# Patient Record
Sex: Female | Born: 1975 | Hispanic: No | Smoking: Never smoker
Health system: Southern US, Community
[De-identification: ages and names within clinical notes are randomized; demographics above are authoritative.]

---

## 1998-01-13 ENCOUNTER — Inpatient Hospital Stay (HOSPITAL_COMMUNITY): Admission: AD | Admit: 1998-01-13 | Discharge: 1998-01-13 | Payer: Self-pay | Admitting: Obstetrics & Gynecology

## 1998-01-16 ENCOUNTER — Ambulatory Visit (HOSPITAL_COMMUNITY): Admission: RE | Admit: 1998-01-16 | Discharge: 1998-01-16 | Payer: Self-pay | Admitting: Obstetrics

## 1998-03-29 ENCOUNTER — Encounter: Admission: RE | Admit: 1998-03-29 | Discharge: 1998-03-29 | Payer: Self-pay | Admitting: Obstetrics & Gynecology

## 1998-03-29 ENCOUNTER — Other Ambulatory Visit: Admission: RE | Admit: 1998-03-29 | Discharge: 1998-03-29 | Payer: Self-pay | Admitting: Obstetrics & Gynecology

## 2001-10-10 ENCOUNTER — Inpatient Hospital Stay (HOSPITAL_COMMUNITY): Admission: AD | Admit: 2001-10-10 | Discharge: 2001-10-10 | Payer: Self-pay | Admitting: *Deleted

## 2001-11-14 ENCOUNTER — Encounter: Admission: RE | Admit: 2001-11-14 | Discharge: 2001-11-14 | Payer: Self-pay | Admitting: Family Medicine

## 2001-11-15 ENCOUNTER — Encounter (INDEPENDENT_AMBULATORY_CARE_PROVIDER_SITE_OTHER): Payer: Self-pay | Admitting: *Deleted

## 2001-11-24 ENCOUNTER — Encounter: Admission: RE | Admit: 2001-11-24 | Discharge: 2001-11-24 | Payer: Self-pay | Admitting: Family Medicine

## 2001-12-09 ENCOUNTER — Ambulatory Visit (HOSPITAL_COMMUNITY): Admission: RE | Admit: 2001-12-09 | Discharge: 2001-12-09 | Payer: Self-pay | Admitting: *Deleted

## 2001-12-09 ENCOUNTER — Encounter: Admission: RE | Admit: 2001-12-09 | Discharge: 2001-12-09 | Payer: Self-pay | Admitting: Family Medicine

## 2001-12-25 ENCOUNTER — Encounter: Admission: RE | Admit: 2001-12-25 | Discharge: 2001-12-25 | Payer: Self-pay | Admitting: Family Medicine

## 2002-01-09 ENCOUNTER — Encounter: Admission: RE | Admit: 2002-01-09 | Discharge: 2002-01-09 | Payer: Self-pay | Admitting: Family Medicine

## 2002-01-20 ENCOUNTER — Encounter: Admission: RE | Admit: 2002-01-20 | Discharge: 2002-01-20 | Payer: Self-pay | Admitting: Family Medicine

## 2002-01-29 ENCOUNTER — Encounter: Admission: RE | Admit: 2002-01-29 | Discharge: 2002-01-29 | Payer: Self-pay | Admitting: Family Medicine

## 2002-03-03 ENCOUNTER — Encounter: Admission: RE | Admit: 2002-03-03 | Discharge: 2002-03-03 | Payer: Self-pay | Admitting: Sports Medicine

## 2002-03-20 ENCOUNTER — Encounter: Admission: RE | Admit: 2002-03-20 | Discharge: 2002-03-20 | Payer: Self-pay | Admitting: Family Medicine

## 2002-03-24 ENCOUNTER — Encounter: Admission: RE | Admit: 2002-03-24 | Discharge: 2002-03-24 | Payer: Self-pay | Admitting: Family Medicine

## 2002-04-09 ENCOUNTER — Encounter: Admission: RE | Admit: 2002-04-09 | Discharge: 2002-04-09 | Payer: Self-pay | Admitting: Family Medicine

## 2002-04-10 ENCOUNTER — Ambulatory Visit (HOSPITAL_COMMUNITY): Admission: RE | Admit: 2002-04-10 | Discharge: 2002-04-10 | Payer: Self-pay | Admitting: Internal Medicine

## 2002-04-17 ENCOUNTER — Encounter: Admission: RE | Admit: 2002-04-17 | Discharge: 2002-04-17 | Payer: Self-pay | Admitting: Family Medicine

## 2002-04-21 ENCOUNTER — Encounter: Admission: RE | Admit: 2002-04-21 | Discharge: 2002-04-21 | Payer: Self-pay | Admitting: Sports Medicine

## 2002-04-22 ENCOUNTER — Encounter (HOSPITAL_COMMUNITY): Admission: RE | Admit: 2002-04-22 | Discharge: 2002-05-22 | Payer: Self-pay | Admitting: *Deleted

## 2002-04-30 ENCOUNTER — Encounter: Admission: RE | Admit: 2002-04-30 | Discharge: 2002-04-30 | Payer: Self-pay | Admitting: Family Medicine

## 2002-05-01 ENCOUNTER — Inpatient Hospital Stay (HOSPITAL_COMMUNITY): Admission: AD | Admit: 2002-05-01 | Discharge: 2002-05-06 | Payer: Self-pay | Admitting: Family Medicine

## 2002-05-03 ENCOUNTER — Encounter: Payer: Self-pay | Admitting: *Deleted

## 2002-05-04 ENCOUNTER — Encounter: Payer: Self-pay | Admitting: *Deleted

## 2002-05-09 ENCOUNTER — Encounter: Payer: Self-pay | Admitting: *Deleted

## 2002-05-09 ENCOUNTER — Inpatient Hospital Stay (HOSPITAL_COMMUNITY): Admission: AD | Admit: 2002-05-09 | Discharge: 2002-05-12 | Payer: Self-pay | Admitting: Family Medicine

## 2002-05-10 ENCOUNTER — Encounter: Payer: Self-pay | Admitting: Family Medicine

## 2002-06-12 ENCOUNTER — Encounter: Admission: RE | Admit: 2002-06-12 | Discharge: 2002-06-12 | Payer: Self-pay | Admitting: Family Medicine

## 2002-07-01 ENCOUNTER — Encounter: Admission: RE | Admit: 2002-07-01 | Discharge: 2002-07-01 | Payer: Self-pay | Admitting: Family Medicine

## 2002-07-23 ENCOUNTER — Encounter: Admission: RE | Admit: 2002-07-23 | Discharge: 2002-07-23 | Payer: Self-pay | Admitting: Family Medicine

## 2002-08-17 ENCOUNTER — Encounter: Admission: RE | Admit: 2002-08-17 | Discharge: 2002-08-17 | Payer: Self-pay | Admitting: Family Medicine

## 2006-02-25 ENCOUNTER — Emergency Department (HOSPITAL_COMMUNITY): Admission: EM | Admit: 2006-02-25 | Discharge: 2006-02-25 | Payer: Self-pay | Admitting: Emergency Medicine

## 2006-03-15 ENCOUNTER — Encounter (INDEPENDENT_AMBULATORY_CARE_PROVIDER_SITE_OTHER): Payer: Self-pay | Admitting: *Deleted

## 2006-05-22 ENCOUNTER — Emergency Department (HOSPITAL_COMMUNITY): Admission: EM | Admit: 2006-05-22 | Discharge: 2006-05-22 | Payer: Self-pay | Admitting: Emergency Medicine

## 2007-07-01 ENCOUNTER — Emergency Department (HOSPITAL_COMMUNITY): Admission: EM | Admit: 2007-07-01 | Discharge: 2007-07-01 | Payer: Self-pay | Admitting: Emergency Medicine

## 2007-07-04 ENCOUNTER — Encounter: Payer: Self-pay | Admitting: *Deleted

## 2007-08-11 ENCOUNTER — Ambulatory Visit: Payer: Self-pay | Admitting: Family Medicine

## 2009-11-16 IMAGING — CR DG CHEST 2V
2 series · 2 of 2 positions shown · non-contrast
Comparison: None available.

CLINICAL DATA: Fever, shaking.

CHEST - 2 VIEW

[w chest pa]
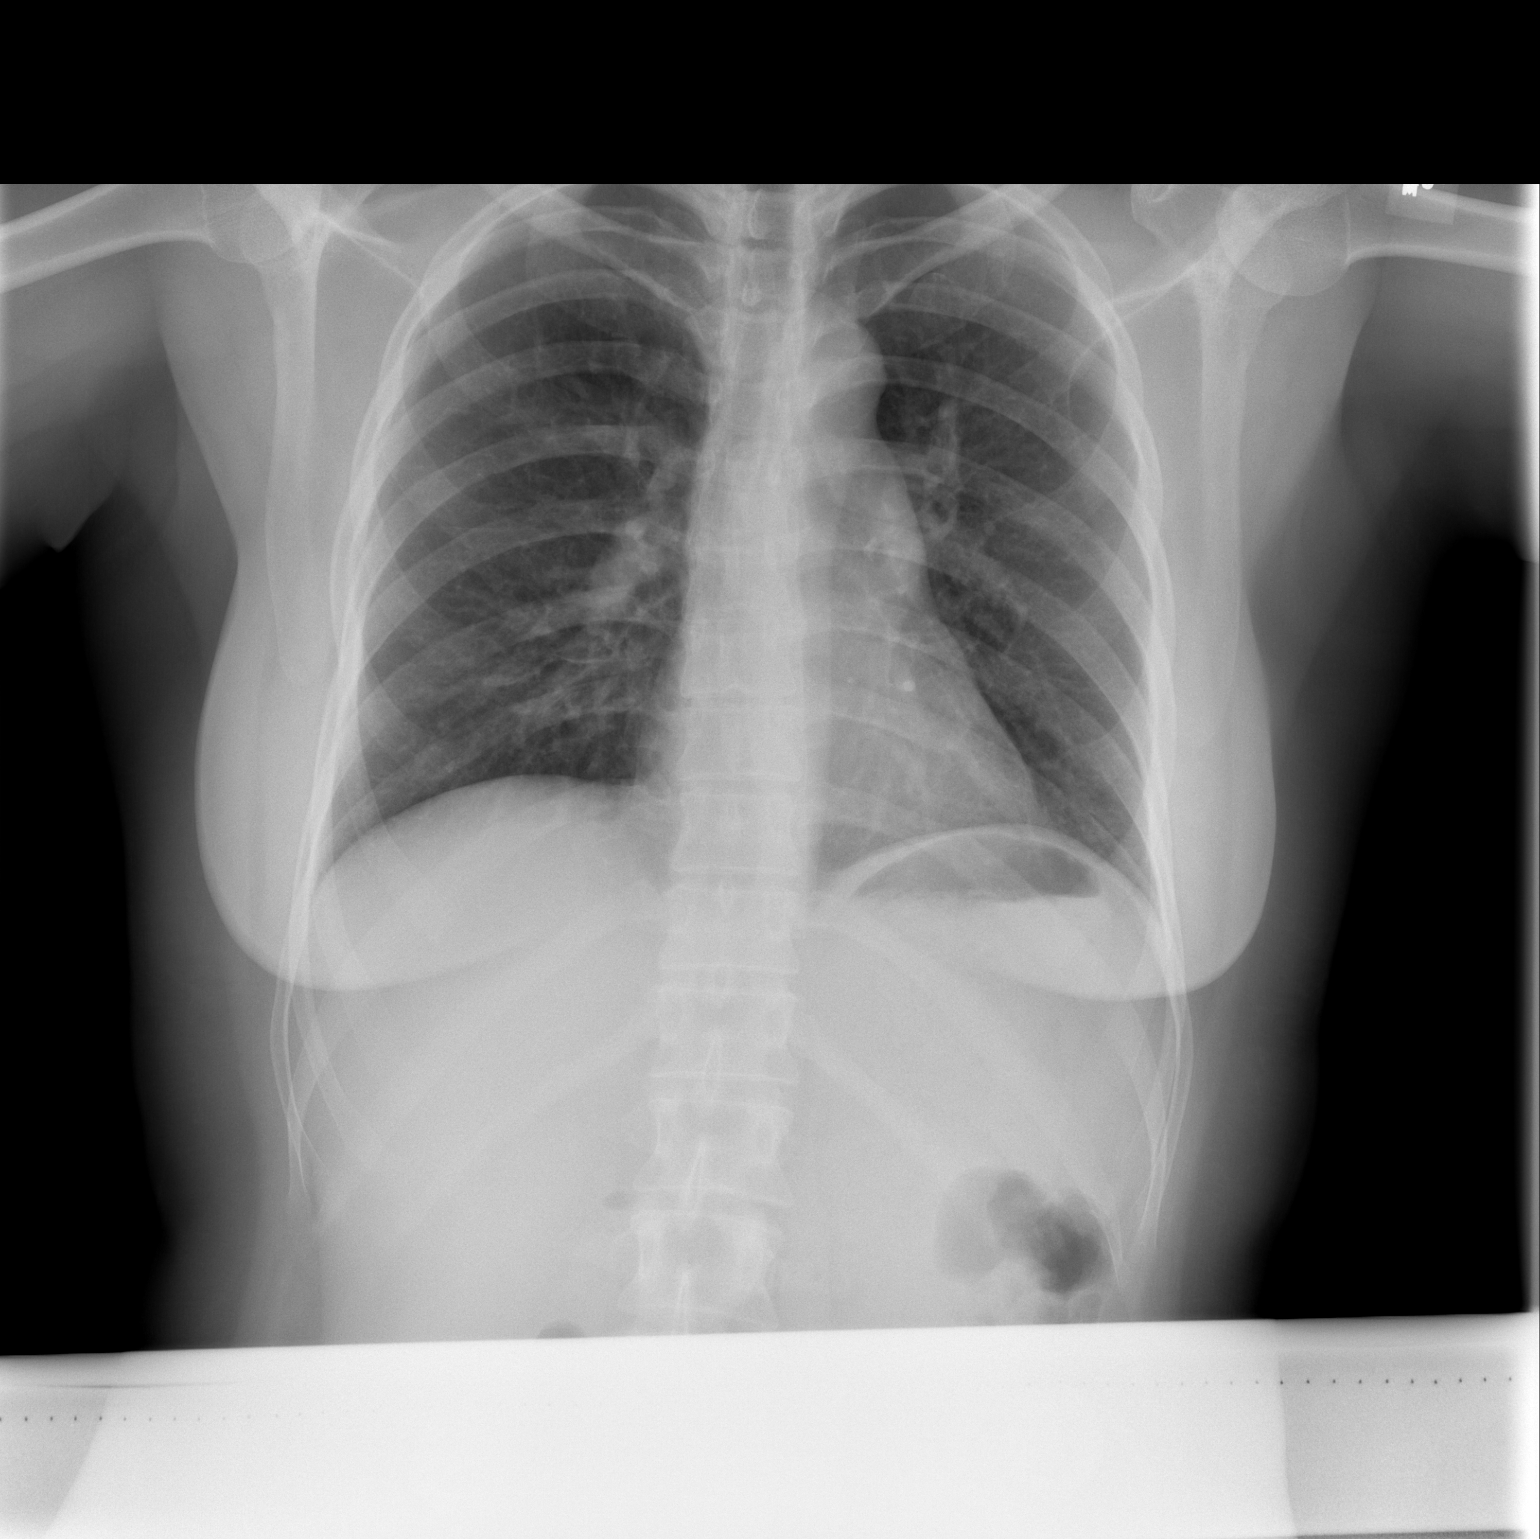

[w chest lat]
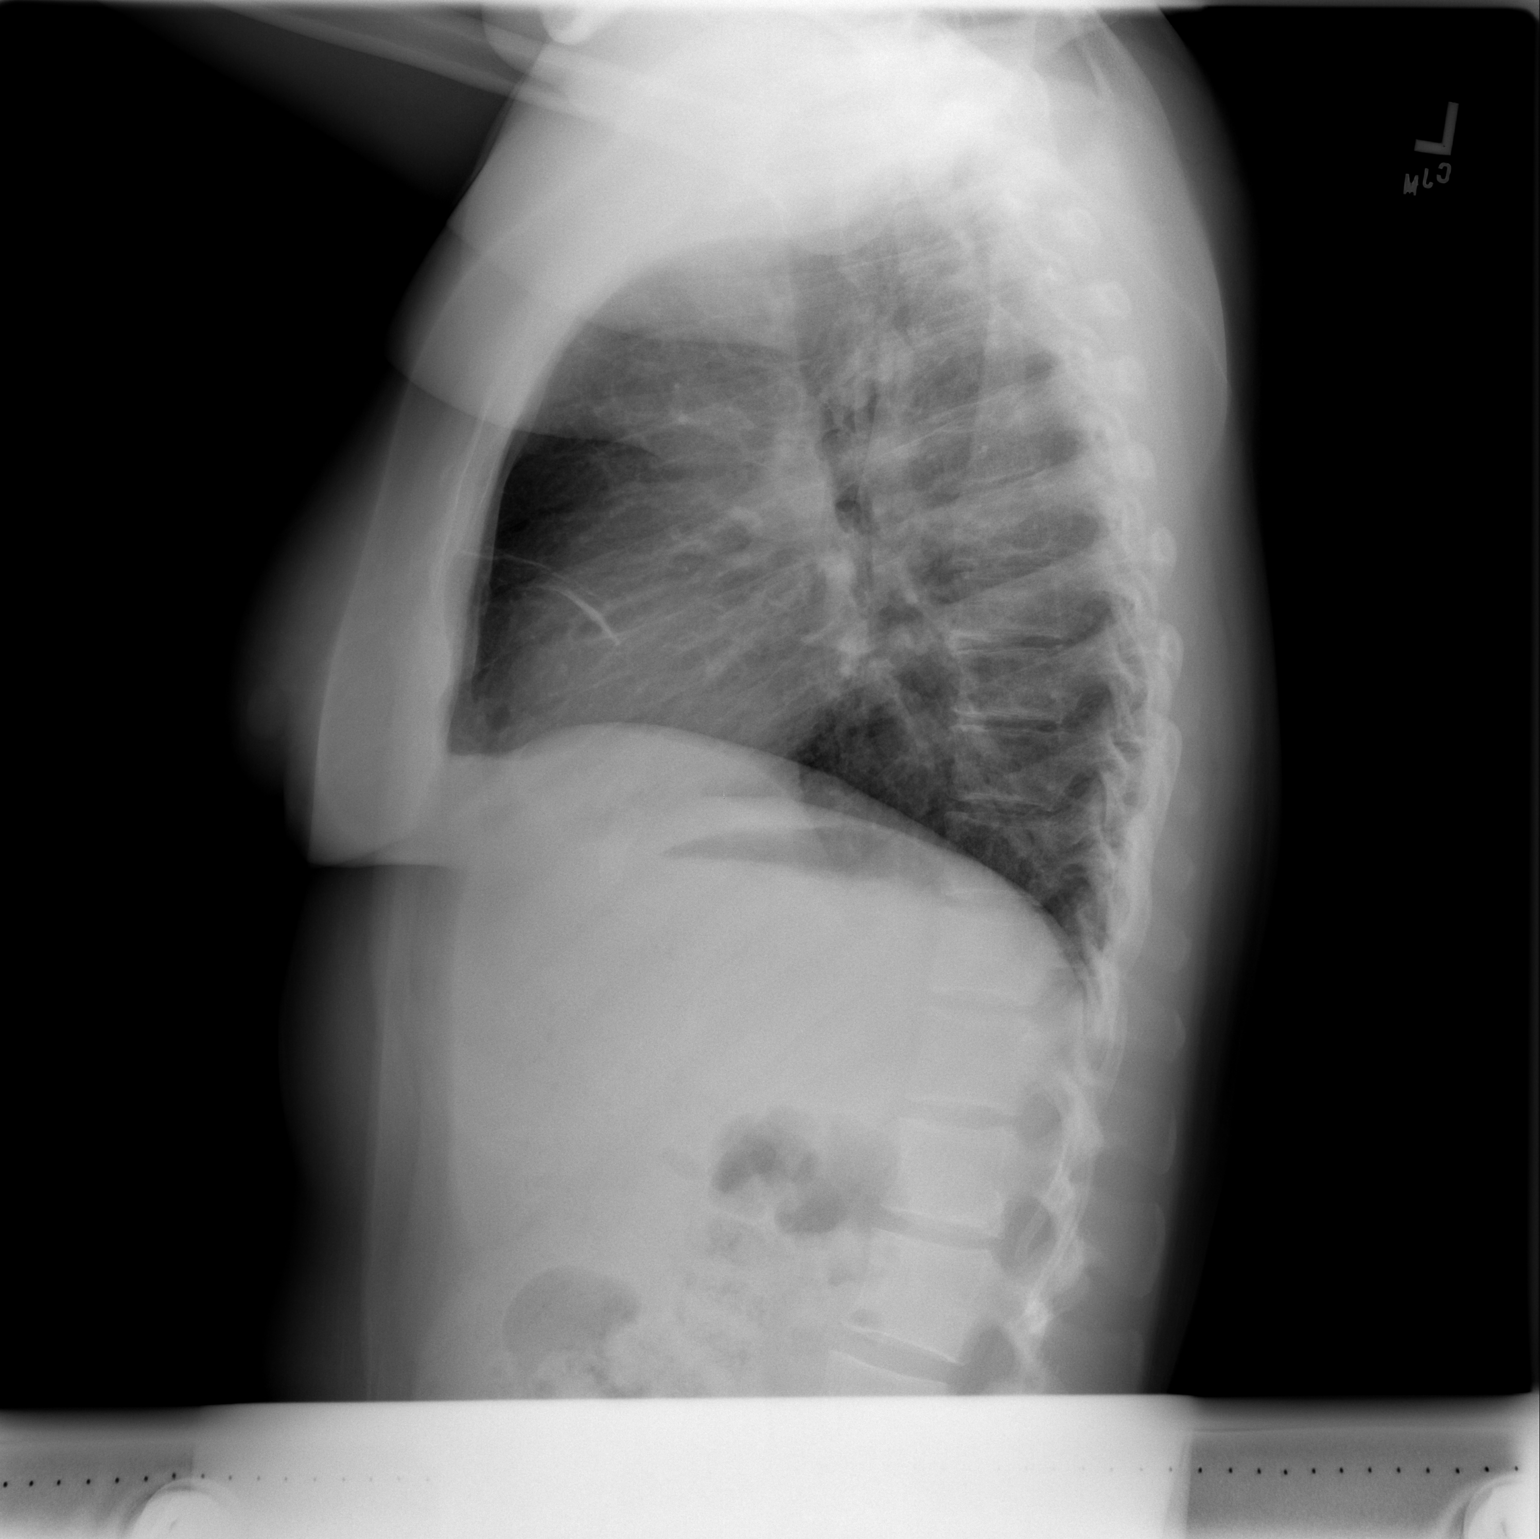

[2 of 2 positions shown; findings below may reference images not displayed]

FINDINGS: There is no focal airspace disease.  Linear opacity seen
on the lateral view is compatible with scarring in the right middle
lobe.  There is no pleural effusion.  Heart size is normal.
IMPRESSION: No acute disease.

## 2010-06-02 NOTE — Discharge Summary (Signed)
NAMEMarland Kitchen  Ellen Moody, Ellen Moody NO.:  [id number]   MEDICAL RECORD NO.:  [id number]                   PATIENT TYPE:  REC   LOCATION:  [facility]                                  FACILITY:  [facility]   PHYSICIAN:  Conni Elliot, M.D.             DATE OF BIRTH:  1975-06-29   DATE OF ADMISSION:  05/09/2002  DATE OF DISCHARGE:  05/12/2002                                 DISCHARGE SUMMARY   PRIMARY CARE Nishka Heide:  Dr. Duanne Guess with [facility].   DISCHARGE DIAGNOSIS - PRINCIPAL:  Preeclampsia.   DISCHARGE DIAGNOSES - ADDITIONAL:  1. Acute renal insufficiency.  2. Suspect chronic renal insufficiency.  3. Status post postpartum hemorrhage.  4. Status post disseminated intravascular coagulation.  5. Status post normal spontaneous vaginal delivery.   SUMMARY OF HOSPITAL COURSE:  1. Preeclampsia.  Patient was initially evaluated, and secondary to systemic     symptoms, including headache, dizziness, lower extremity edema, and     abdominal pain, patient had developed preeclampsia.  Initial labs     demonstrated uric acid at 7.8, platelets 260, UA was negative.  Patient     was begun on magnesium, per routine.  Patient's mag level was monitored     closely and was discontinued on the 26th and, since that time, has     trended downwards, now a level of 3.1 compared to previous day of 5.3.     Patient did develop diplopia secondary to supratherapeutic magnesium     level, which has resolved now that magnesium has been turned off and her     mag level has trended downwards.  Patient has undergone significant     diuresis with -1403 in the past 24 hours and -250 in the last eight     hours.  Lower extremity edema, abdominal pain, headache have all fully     resolved.  Patient continues to complain of slight dizziness while lying     in bed, but this resolves with ambulation.  It is felt that this is     secondary to her resolving clinical picture of preeclampsia as  well as     still elevated magnesium level at 3.1, which is trending downward.  At     time of dictation, patient is stable and in improved condition and, given     opportunity for close hospital followup, is felt to have resolved from     her preeclamptic state.  The initial inciting event for this is felt to     be her previous hospitalization surrounding the birth of her child,     during which time she developed postpartum hemorrhage with questionable     DIC.  Patient did receive multiple blood products and significant fluid     resuscitation.  It was felt that her baseline chronic renal insufficiency     allowed her body to, following discharge, essentially achieve a fluid  overload state, which then lead to her admitting symptoms, per above.   1. Headache.  Per above, initial admitting headache has fully resolved.     Patient did undergo radiologic studies, including CT of the head.     Impression (1)  I cannot entirely exclude the possibility of acute     pituitary hemorrhage.  This is a difficult area to evaluate on CT, and     the findings may just be related to beam-hardening artifacts.     Correlation as to symptoms of pituitary apexes.  MRI may be beneficial to     deny this finding.  (2)  Normal CT otherwise.  No abnormal enhancement     was identified.  Patient then underwent MRI with preliminary report,     pituitary normal for postpartum state, a few small foci of subcortical     signal and frontoparietal lobes - nonspecific white matter lesions can be     seen with migraines, vasculitis, etc.  Normal diffusion study; no acute     stroke.  Normal MR venogram.  Patient's symptoms have fully resolved, and     given negative workup, it is felt that her initial neurological symptoms     were related to an edematous cerebral state, which is compatible with her     total body fluid overload in her preeclamptic state.   1. Renal.  Patient's earliest creatinine was 1.1; this  elevated to 1.5     following her complicated delivery.  This reached an apex of 1.8 and     since that time has trended back down to 1.5 where it has been stable.     It is unclear whether patient has an underlying renal insufficiency,     which predates her admission for delivery.  It is felt that at this time     patient is certainly stable and has diuresed well, kidney function is     stable, and patient is appropriate for discharge.  In consultation with     Dr. Gavin Potters, patient would be appropriate for close followup at both her     six-week postpartum visit with her primary care Marene Gilliam, Dr. Duanne Guess, as     well as approximately three months out, reevaluating her renal function     with repeat creatinine, and if continues to be elevated, further     evaluation and treatment for her likely then chronic renal insufficiency     could be pursued.   1. Vasculitis.  Per the MRI findings, the results are compatible with a     vasculitis; this workup was begun during this hospitalization.  Labs     pending at time of dictation include ANA, anticardiolipin antibody,     antiphospholipid antibody, anti-SS-A/Ro, anti-SS-B/LA.  Labs that have     come back at time of dictation include antithyroid antibody normal at     less than 30, thyroid peroxidase normal at 44.3.  It is recommended that     at time of followup with her primary care Lemmie Steinhaus, Dr. Duanne Guess, these labs     be reevaluated as well as followup on her pending labs at this time.     Patient did have an elevated ESR at 50.   DISCHARGE INSTRUCTIONS:  Patient is to follow up with her primary care  Vinita Prentiss, Dr. Duanne Guess, for routine postpartum six-week visit as well as in  approximately three months' time for reevaluation of her renal function  with, at a  minimum, BUN and creatinine being drawn.   DISCHARGE MEDICATIONS - NEW:  Percocet one tab q.4h. p.r.n. headache, number  10, 0 refills.  DISCHARGE MEDICATIONS - CONTINUED FROM PRIOR TO  ADMISSION:  1. Ferrous sulfate.  2. Colace.  3. Prenatal vitamins.  4. Ibuprofen.   DISCHARGE ACTIVITY:  Pelvic rest times six weeks.   DISCHARGE DIET:  Regular.     Loleta Books, M.D.    DL/MEDQ  D:  16/10/9602  T:  05/12/2002  Job:  [id number]   cc:   Maryelizabeth Rowan, M.D.  [facility]  [place], [place] [address]  Fax: [phone]

## 2010-10-12 LAB — COMPREHENSIVE METABOLIC PANEL WITH GFR
ALT: 57 — ABNORMAL HIGH
AST: 65 — ABNORMAL HIGH
Albumin: 3.8
Alkaline Phosphatase: 80
CO2: 21
Chloride: 104
Creatinine, Ser: 0.69
GFR calc Af Amer: >60
GFR calc non Af Amer: >60
Potassium: 3.9
Total Bilirubin: 1.1

## 2010-10-12 LAB — CBC
HCT: 35.4 — ABNORMAL LOW
Hemoglobin: 12.4
MCHC: 35
MCV: 87.6
Platelets: 214
RBC: 4.04
RDW: 12.8
WBC: 8.6

## 2010-10-12 LAB — URINE MICROSCOPIC-ADD ON

## 2010-10-12 LAB — DIFFERENTIAL
Basophils Absolute: 0
Basophils Relative: 0
Eosinophils Absolute: 0
Eosinophils Relative: 0
Lymphocytes Relative: 17
Lymphs Abs: 1.5
Monocytes Absolute: 0.2
Monocytes Relative: 2 — ABNORMAL LOW
Neutro Abs: 6.8
Neutrophils Relative %: 80 — ABNORMAL HIGH

## 2010-10-12 LAB — URINALYSIS, ROUTINE W REFLEX MICROSCOPIC
Bilirubin Urine: NEGATIVE
Glucose, UA: NEGATIVE
Hgb urine dipstick: NEGATIVE
Ketones, ur: NEGATIVE
Nitrite: NEGATIVE
Protein, ur: NEGATIVE
Specific Gravity, Urine: 1.016
Urobilinogen, UA: 1
pH: 8

## 2010-10-12 LAB — COMPREHENSIVE METABOLIC PANEL
BUN: 12
Calcium: 8.8
Glucose, Bld: 119 — ABNORMAL HIGH
Sodium: 136
Total Protein: 6.8

## 2010-10-12 LAB — POCT PREGNANCY, URINE: Preg Test, Ur: NEGATIVE

## 2010-10-12 LAB — INFLUENZA A+B VIRUS AG-DIRECT(RAPID): Inflenza A Ag: NEGATIVE

## 2013-05-14 ENCOUNTER — Encounter: Payer: Self-pay | Admitting: Family Medicine

## 2013-05-14 ENCOUNTER — Other Ambulatory Visit (HOSPITAL_COMMUNITY)
Admission: RE | Admit: 2013-05-14 | Discharge: 2013-05-14 | Disposition: A | Discharge status: Home / Self Care | Payer: PRIVATE HEALTH INSURANCE | Source: Ambulatory Visit | Attending: Family Medicine | Admitting: Family Medicine

## 2013-05-14 ENCOUNTER — Ambulatory Visit (INDEPENDENT_AMBULATORY_CARE_PROVIDER_SITE_OTHER): Payer: Self-pay | Admitting: Family Medicine

## 2013-05-14 VITALS — BP 122/77 | HR 80 | Temp 97.7°F | Ht 61.0 in | Wt 130.0 lb

## 2013-05-14 DIAGNOSIS — Z113 Encounter for screening for infections with a predominantly sexual mode of transmission: Secondary | ICD-10-CM | POA: Insufficient documentation

## 2013-05-14 DIAGNOSIS — N760 Acute vaginitis: Secondary | ICD-10-CM

## 2013-05-14 DIAGNOSIS — B373 Candidiasis of vulva and vagina: Secondary | ICD-10-CM

## 2013-05-14 DIAGNOSIS — R109 Unspecified abdominal pain: Secondary | ICD-10-CM

## 2013-05-14 DIAGNOSIS — N912 Amenorrhea, unspecified: Secondary | ICD-10-CM

## 2013-05-14 DIAGNOSIS — F32A Depression, unspecified: Secondary | ICD-10-CM

## 2013-05-14 DIAGNOSIS — F329 Major depressive disorder, single episode, unspecified: Secondary | ICD-10-CM

## 2013-05-14 DIAGNOSIS — B3731 Acute candidiasis of vulva and vagina: Secondary | ICD-10-CM

## 2013-05-14 DIAGNOSIS — F3289 Other specified depressive episodes: Secondary | ICD-10-CM

## 2013-05-14 DIAGNOSIS — Z3009 Encounter for other general counseling and advice on contraception: Secondary | ICD-10-CM

## 2013-05-14 DIAGNOSIS — Z1151 Encounter for screening for human papillomavirus (HPV): Secondary | ICD-10-CM | POA: Insufficient documentation

## 2013-05-14 DIAGNOSIS — Z124 Encounter for screening for malignant neoplasm of cervix: Secondary | ICD-10-CM

## 2013-05-14 DIAGNOSIS — M25559 Pain in unspecified hip: Secondary | ICD-10-CM

## 2013-05-14 DIAGNOSIS — Z01419 Encounter for gynecological examination (general) (routine) without abnormal findings: Secondary | ICD-10-CM | POA: Insufficient documentation

## 2013-05-14 LAB — POCT WET PREP (WET MOUNT)
CLUE CELLS WET PREP WHIFF POC: NEGATIVE
WBC, Wet Prep HPF POC: >20

## 2013-05-14 LAB — POCT URINE PREGNANCY: PREG TEST UR: NEGATIVE

## 2013-05-14 MED ORDER — FLUCONAZOLE 150 MG PO TABS: 150.0000 mg | ORAL_TABLET | Freq: Once | ORAL | Status: DC

## 2013-05-14 MED ORDER — NORGESTIMATE-ETH ESTRADIOL 0.25-35 MG-MCG PO TABS: 1.0000 | ORAL_TABLET | Freq: Every day | ORAL | Status: DC

## 2013-05-14 NOTE — Assessment & Plan Note (Signed)
Urine pregnancy negative today Ongoing abdominal pain for 7 months is concerning, especially with abnormal pelvic exam in pain. Have ordered ultrasound of the pelvis today. Patient to followup in 2 weeks we'll discuss ultrasound results and at that time she should have her orange card. Will order her yearly labs at that time as well.

## 2013-05-14 NOTE — Assessment & Plan Note (Addendum)
Cervical Pap screening performed today. Added gonorrhea and Chlamydia culture to Pap. Will call patient with results

## 2013-05-14 NOTE — Assessment & Plan Note (Signed)
Patient with wet prep today showing yeast. Diflucan prescription given

## 2013-05-14 NOTE — Patient Instructions (Addendum)
Eleccin del mtodo anticonceptivo (Contraception Choices) La anticoncepcin (control de la natalidad) es el uso de cualquier mtodo o dispositivo para evitar el embarazo. A continuacin se indican algunos de esos mtodos. MTODOS HORMONALES   El Implante contraconceptivo consiste en un tubo plstico delgado que contiene la hormona progesterona. No contiene estrgenos. El mdico inserta el tubo en la parte interna del brazo. El tubo puede permanecer en el lugar durante 3 aos. Despus de los 3 aos debe retirarse. El implante impide que los ovarios liberen vulos (ovulacin), espesa el moco cervical, lo que evita que los espermatozoides ingresen al tero y hace ms delgada la membrana que cubre el interior del tero.  Inyecciones de progesterona sola: las administra el mdico cada 3 meses para evitar el embarazo. La progesterona sinttica impide que los ovarios liberen vulos. Tambin hacen que el moco cervical se espese y modifique el tejido de recubrimiento interno del tero. Esto hace ms difcil que los espermatozoides sobrevivan en el tero.  Las pldoras anticonceptivas contienen estrgenos y progesterona. Su funcin es evitar que los ovarios liberen vulos (ovulacin). Las hormonas de los anticonceptivos orales hacen que el moco cervical se haga ms espeso, lo que evita que el esperma ingrese al tero. Las pldoras anticonceptivas son recetadas por el mdico.Tambin se utilizan para tratar los perodos menstruales abundantes.  Minipldora: este tipo de pldora anticonceptiva contiene slo hormona progesterona. Deben tomarse todos los das del mes y debe recetarlas el mdico.  El parche de control de natalidad: contiene hormonas similares a las que contienen las pldoras anticonceptivas. Deben cambiarse una vez por semana y se utilizan bajo prescripcin mdica.  Anillo vaginal: contiene hormonas similares a las que contienen las pldoras anticonceptivas. Se deja colocado durante tres semanas,  se lo retira durante 1 semana y luego se coloca uno nuevo. La paciente debe sentirse cmoda al insertar y retirar el anillo de la vagina.Es necesaria la prescripcin mdica.  Anticonceptivos de emergencia: son mtodos para evitar un embarazo despus de una relacin sexual sin proteccin. Esta pldora puede tomarse inmediatamente despus de tener relaciones sexuales o hasta 5 das de haber tenido sexo sin proteccin. Es ms efectiva si se toma poco tiempo despus de la relacin sexual. Los anticonceptivos de emergencia estn disponibles sin prescripcin mdica. Consltelo con su farmacutico. No use los anticonceptivos de emergencia como nico mtodo anticonceptivo. MTODOS DE BARRERA   Condn masculino: es una vaina delgada (ltex o goma) que se coloca cubriendo al pene durante el acto sexual. Puede usarse con espermicida para aumentar la efectividad.  Condn femenino. Es una funda delicada y blanda que se adapta holgadamente a la vagina antes de las relaciones sexuales.  Diafragma: es una barrera de ltex redonda y suave que debe ser recomendado por un profesional. Se inserta en la vagina, junto con un gel espermicida. Debe insertarse antes de tener relaciones sexuales. Debe dejar el diafragma colocado en la vagina durante 6 a 8 horas despus de la relacin sexual.  Capuchn cervical: es una barrera de ltex o taza plstica redonda y suave que cubre el cuello del tero y debe ser colocada por un mdico. Puede dejarlo colocado en la vagina hasta 48 horas despus de las relaciones sexuales.  Esponja: es una pieza blanda y circular de espuma de poliuretano. Contiene un espermicida. Se inserta en la vagina despus de mojarla y antes de las relaciones sexuales.  Espermicidas: son sustancias qumicas que matan o bloquean al esperma y no lo dejan ingresar al cuello del tero y al tero. Vienen   en forma de cremas, geles, supositorios, espuma o comprimidos. No es necesario tener Furniture conservator/restorer. Se insertan en  la vagina con un aplicador antes de Clinical biochemist. El proceso debe repetirse cada vez que tiene relaciones sexuales. ANTICONCEPTIVOS INTRAUTERINOS  Dispositivo intrauterino (DIU) es un dispositivo en forma de T que se coloca en el tero durante el perodo menstrual, para Therapist, occupational. Hay dos tipos:  DIU de cobre: este tipo de DIU est recubierto con un alambre de cobre y se inserta dentro del tero. El cobre hace que el tero y las trompas de Falopio produzcan un liquido que [employer] espermatozoides. Puede permanecer colocado durante 10 aos.  DIU con hormona: este tipo de DIU contiene la hormona progestina (progesterona sinttica). La hormona espesa el moco cervical y evita que los espermatozoides ingresen al tero y tambin afina la membrana que cubre el tero para evitar la implantacin del vulo fertilizado. La hormona debilita o destruye los espermatozoides que ingresan al tero. Puede Nutritional therapist durante 3 5 aos, segn el tipo de DIU que se utilice. MTODOS ANTICONCEPTIVOS PERMANENTES  Ligadura de trompas en la mujer: se realiza sellando, atando u obstruyendo quirrgicamente las trompas de Falopio lo que impide que el vulo descienda hacia el tero.  Esterilizacin histeroscpica: Implica la colocacin de un pequeo espiral o la insercin en cada trompa de Falopio. El mdico utiliza una tcnica llamada histeroscopa para Teacher, music procedimiento. El dispositivo produce la formacin de tejido Pensions consultant. Esto da como resultado una obstruccin permanente de las trompas de Falopio, de modo que la esperma no pueda fertilizar el vulo. Demora alrededor de 3 meses despus del procedimiento hasta que el conducto se obstruye. Tendr que usar otro mtodo anticonceptivo durante al menos 3 meses.  Esterilizacin masculina: se realiza ligando los conductos por los que pasan los espermatozoides (vasectoma).Esto impide que el esperma ingrese a la vagina durante el acto  sexual. Luego del procedimiento, el hombre puede eyacular lquido (semen). MTODOS DE PLANIFICACIN NATURAL  Planificacin familiar natural: consiste en no Clinical biochemist o usar un mtodo de barrera (condn, [place], capuchn cervical) en los [employer] la mujer podra quedar [place].  Mtodo de calendario: consiste en el seguimiento de la duracin de cada ciclo menstrual y la identificacin de los perodos frtiles.  Mtodo de ovulacin: Dance movement psychotherapist las relaciones sexuales durante la ovulacin.  Mtodo sintotrmico: Energy manager sexuales en la poca en la que se est ovulando, utilizando un termmetro y tendiendo en cuenta los sntomas de la ovulacin.  Mtodo postovulacin: Museum/gallery conservator las relaciones sexuales para despus de haber ovulado. Independientemente del tipo o mtodo anticonceptivo que usted elija, es importante que use condones para protegerse contra las infecciones de transmisin sexual (ETS). Hable con su mdico con respecto a qu mtodo anticonceptivo es el ms apropiado para usted. Document Released: 01/01/2005 Document Revised: 09/03/2012 [facility] Patient Information 2014 [place], [place].  Uso de los Electronics engineer (Oral Contraception Use) Los anticonceptivos orales (ACO) son medicamentos que se utilizan para Therapist, occupational. Su funcin es [employer] ovarios liberen vulos. Las hormonas de los ACO tambin hacen que el moco cervical se haga ms espeso, lo que evita que el esperma ingrese al tero. Tambin hacen que la membrana que recubre internamente al tero se vuelva ms fina, lo que no permite que el huevo fertilizado se adhiera a la pared del tero. Los ACO son muy efectivos cuando se toman exactamente como se prescriben. Sin embargo, no  previenen contra las enfermedades de transmisin sexual (ETS). La prctica del sexo seguro, como el uso de preservativos, junto con los ACO, [place] a prevenir ese tipo de  enfermedades. Antes de tomar ACO, debe hacerse un examen fsico y un Papanicolau. El mdico podr indicarle anlisis de [place], si es necesario. El mdico se asegurar de que usted sea [place] buena candidata para usar anticonceptivos orales. Converse con su mdico acerca de los posibles efectos secundarios de los ACO que podran recetarle. Cuando se inicia el uso de ACO, se pueden tomar durante 2 a 3 meses para que el cuerpo se adapte a los cambios en los niveles hormonales en el cuerpo.  CMO TOMAR LOS ANTICONCEPTIVOS ORALES El mdico le indicar como comenzar a Holiday representative de ACO. De lo contrario usted puede:   Journalist, newspaper de inicio del ciclo menstrual. No necesitar proteccin anticonceptiva adicional al Engineer, manufacturing.   Comenzar Software engineer domingo luego de su perodo menstrual, o Games developer en que adquiere el [employer]. En estos casos deber [employer] proteccin anticonceptiva [employer] primeros 7 das del [place].   Comenzar a tomarlos en cualquier momento del ciclo. Si toma el anticonceptivo dentro de los 5 das de iniciado el perodo, Personnel officer protegida de quedar embarazada inmediatamente. En este caso, no necesitar una forma adicional de anticonceptivos. Si comienza en cualquier otro momento del ciclo menstrual, necesitar usar otra forma de anticonceptivo durante 7 das. Si sus ACO son del tipo de los [employer], podrn impedir el embarazo despus de tomarlas por 2 das (48 horas). Luego de comenzar a tomar los ACO:   Si olvid de tomar 1 pldora, tmela tan pronto como lo recuerde. Tome la siguiente pldora a la hora habitual.   Si dej de tomar 2 o ms pldoras, comunquese con su mdico ya que diferentes pldoras tienen diferentes instrucciones para las dosis que no se han tomado. Si olvida tomar 2 o ms pldoras, utilice un mtodo anticonceptivo adicional hasta que comience su prximo perodo menstrual.   Si utiliza el envase de 28 pldoras que  contienen pldoras inactivas y [place] tomar 1 de las ltimas 7 (pldoras sin hormonas), sto no tiene Education administrator. Simplemente deseche el resto de las pldoras que no contienen hormonas y comience un nuevo envase.  No importa cuando comience a tomar los anticonceptivos, siempre empiece un nuevo envase el mismo da de la [place]. Tenga un envase extra de ACO y use un mtodo anticonceptivo adicional para Education officer, museum en que se olvide de tomar algunas pldoras o pierda la caja.  INSTRUCCIONES PARA EL CUIDADO EN EL HOGAR   No fume.   Use siempre un condn para protegerse contra las enfermedades de transmisin sexual. Los ACO no protegen contra las enfermedades de transmisin sexual.   Use un almanaque para [employer] de su perodo menstrual.   Lea la informacin y consejos que vienen con las ACO. Hable con el profesional si tiene dudas.  SOLICITE ATENCIN MDICA SI:   Presenta nuseas o vmitos.   Tiene flujo o sangrado vaginal anormal.   Aparece una erupcin cutnea.   No tiene el perodo menstrual.   Pierde el cabello.   Necesita tratamiento por cambios en su estado de nimo o por depresin.   Se siente mareada al [employer].   Comienza a aparecer acn con el uso de los ACO.   Thyra Breed.  SOLICITE ATENCIN MDICA DE INMEDIATO SI:   Siente dolor en el pecho.  Le falta el aire.   Le duele mucho la cabeza y no puede Financial controller.   Siente adormecimiento o tiene dificultad para hablar.   Tiene problemas de visin.   Presenta dolor, inflamacin o hinchazn en las piernas.  Document Released: 12/21/2010 Document Revised: 09/03/2012 [facility] Patient Information 2014 [place], [place].

## 2013-05-14 NOTE — Progress Notes (Addendum)
Subjective:    Patient ID: Ellen Moody, female    DOB: [birth year], 37 y.o.   MRN: [id number]  HPI Ellen Moody is a 38 y.o. spanish speaking female presented to clinic today for new patient establishment. In addition she has complaints of abdominal pain.  Abdominal pain: Patient reports she's had lower bilateral abdominal pain/pelvic pain for approximately 7 months. Patient had been on an IUD that was taking out about 7 months ago. She then received a Depo-Provera shot which she did not like. She reports a fullness in her abdomen/pelvic area. 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 Bending over and lifting can irritate her pelvic pain. She reports she was sent to [facility] from [place] for possible abnormal pelvic exam. She does not seem to have a great grasp on exactly what the diagnosis or problem or even outcome was at that time. She states that they didn't do any imaging studies that she is aware. Her abdominal pain although intermittent has been unchanged since it started 7 months ago. She denies dysuria or frequency. She states her menstrual cycles, since being off the IUD than on the Depo-Provera, have been minimal. She was due for her last Depo-Provera shot last month. She states she had a menses on April 4 that lasted 10 days. She reports using condoms for birth control.   Contraception counseling: Patient currently using condoms for birth control see above for details. She would like to possibly start birth control pills for contraception. She is a nonsmoker. No known history of blood clots or migraines.  Health maintenance: Patient states that she had a tetanus shot in 2014. She also states that she had a Pap smear in 2014 however she doesn't know the results to this Pap smear. She has a vague description of being sent to [facility] from [place] for followup on possible abnormal pelvic  exam.  Depression: 2 question review resulted with positive depression screen. PHQ 9 was completed with interperter  With a result of 10. Pt states she does not desire medications at this time and wants to be more physically active ti help with her depression. She can not be more physically active because her abdomen hurts with activity.   Social: Patient is married. G4 P3013. She had 2 vaginal deliveries, and last was an emergent C-section due to fetal decelerations. Patient is a nonsmoker. She denies alcohol use. He denies drug use. She is a Office manager. She feels safe in her relationship.  Patient's past medical, social, and family history were reviewed and updated  Review of Systems Negative, with the exception of above mentioned in HPI     Objective:   Physical Exam BP 122/77  Pulse 80  Temp(Src) 97.7 F (36.5 C) (Oral)  Ht 5\' 1"  (1.549 m)  Wt 130 lb (58.968 kg)  BMI 24.58 kg/m2  LMP 04/19/2013 Gen: Well-nourished, well-developed Spanish-speaking female. No acute distress today. Pleasant. HEENT: AT. [place]. Bilateral TM visualized and normal in appearance. Bilateral eyes without injections or icterus. MMM. Bilateral nares without erythema. Throat without erythema or exudates.  CV: RRR, no murmur appreciated. No clicks gallops or rubs. Chest: CTAB, no wheeze or crackles Abd: Upper abdomen soft, questionable right-sided pelvic mass.  Tenderness to palpation right greater than left lower abdomen. Enlarged uterus. BS present.  Ext: No erythema. No edema.  Skin: No rashes, purpura or petechiae.  Neuro:  Normal  gait. PERLA. EOMi. Alert. Grossly intact.  Psych: Normal affect, dressed, demeanor and normal speech  GYN:  External genitalia within normal limits.  Vaginal mucosa pink, moist, normal rugae.  Nonfriable cervix without lesions, moderate weight discharge. No bleeding noted on speculum exam.  Bimanual exam revealed enlarged, nongravid uterus.  Mild to moderate cervical  motion tenderness. Adnexal fullness right greater than left.

## 2013-05-14 NOTE — Assessment & Plan Note (Signed)
Your pregnancy negative today Prescription for Sprintec birth control pills given

## 2013-05-18 DIAGNOSIS — F32A Depression, unspecified: Secondary | ICD-10-CM | POA: Insufficient documentation

## 2013-05-18 DIAGNOSIS — F329 Major depressive disorder, single episode, unspecified: Secondary | ICD-10-CM | POA: Insufficient documentation

## 2013-05-18 LAB — CERVICOVAGINAL ANCILLARY ONLY
Chlamydia: NEGATIVE
Neisseria Gonorrhea: NEGATIVE

## 2013-05-18 NOTE — Assessment & Plan Note (Signed)
Pt PHQ9 of 10. Options of treatment were discussed. Pt does not desire medications and wants to become more physically active. Declines further management at this time.

## 2013-05-20 ENCOUNTER — Ambulatory Visit: Payer: PRIVATE HEALTH INSURANCE

## 2013-05-20 ENCOUNTER — Encounter: Payer: Self-pay | Admitting: Family Medicine

## 2013-05-21 ENCOUNTER — Ambulatory Visit
Admission: RE | Admit: 2013-05-21 | Discharge: 2013-05-21 | Disposition: A | Discharge status: Home / Self Care | Payer: PRIVATE HEALTH INSURANCE | Source: Ambulatory Visit | Attending: Family Medicine | Admitting: Family Medicine

## 2013-05-21 ENCOUNTER — Encounter: Payer: Self-pay | Admitting: Family Medicine

## 2013-05-21 DIAGNOSIS — D259 Leiomyoma of uterus, unspecified: Secondary | ICD-10-CM | POA: Insufficient documentation

## 2013-05-21 DIAGNOSIS — R109 Unspecified abdominal pain: Secondary | ICD-10-CM

## 2013-05-28 ENCOUNTER — Ambulatory Visit (INDEPENDENT_AMBULATORY_CARE_PROVIDER_SITE_OTHER): Payer: No Typology Code available for payment source | Admitting: Family Medicine

## 2013-05-28 ENCOUNTER — Encounter: Payer: Self-pay | Admitting: Family Medicine

## 2013-05-28 VITALS — BP 105/72 | HR 75 | Temp 98.3°F | Ht 60.75 in | Wt 129.6 lb

## 2013-05-28 DIAGNOSIS — R109 Unspecified abdominal pain: Secondary | ICD-10-CM

## 2013-05-28 DIAGNOSIS — R635 Abnormal weight gain: Secondary | ICD-10-CM

## 2013-05-28 DIAGNOSIS — B373 Candidiasis of vulva and vagina: Secondary | ICD-10-CM

## 2013-05-28 DIAGNOSIS — N83209 Unspecified ovarian cyst, unspecified side: Secondary | ICD-10-CM

## 2013-05-28 DIAGNOSIS — Z3009 Encounter for other general counseling and advice on contraception: Secondary | ICD-10-CM

## 2013-05-28 DIAGNOSIS — D259 Leiomyoma of uterus, unspecified: Secondary | ICD-10-CM

## 2013-05-28 DIAGNOSIS — B3731 Acute candidiasis of vulva and vagina: Secondary | ICD-10-CM

## 2013-05-28 DIAGNOSIS — N83201 Unspecified ovarian cyst, right side: Secondary | ICD-10-CM | POA: Insufficient documentation

## 2013-05-28 LAB — COMPREHENSIVE METABOLIC PANEL
ALT: 42 U/L — AB (ref 0–35)
AST: 28 U/L (ref 0–37)
Albumin: 4.3 g/dL (ref 3.5–5.2)
Alkaline Phosphatase: 64 U/L (ref 39–117)
BILIRUBIN TOTAL: 0.5 mg/dL (ref 0.2–1.2)
BUN: 10 mg/dL (ref 6–23)
CHLORIDE: 103 meq/L (ref 96–112)
CO2: 24 meq/L (ref 19–32)
CREATININE: 0.62 mg/dL (ref 0.50–1.10)
Calcium: 9 mg/dL (ref 8.4–10.5)
Glucose, Bld: 100 mg/dL — ABNORMAL HIGH (ref 70–99)
Potassium: 4.1 mEq/L (ref 3.5–5.3)
SODIUM: 133 meq/L — AB (ref 135–145)
TOTAL PROTEIN: 6.9 g/dL (ref 6.0–8.3)

## 2013-05-28 LAB — CBC WITH DIFFERENTIAL/PLATELET
BASOS ABS: 0.1 10*3/uL (ref 0.0–0.1)
Basophils Relative: 1 % (ref 0–1)
EOS ABS: 0.1 10*3/uL (ref 0.0–0.7)
EOS PCT: 1 % (ref 0–5)
HCT: 36 % (ref 36.0–46.0)
Hemoglobin: 12.2 g/dL (ref 12.0–15.0)
LYMPHS ABS: 1.6 10*3/uL (ref 0.7–4.0)
LYMPHS PCT: 26 % (ref 12–46)
MCH: 30.2 pg (ref 26.0–34.0)
MCHC: 33.9 g/dL (ref 30.0–36.0)
MCV: 89.1 fL (ref 78.0–100.0)
Monocytes Absolute: 0.4 10*3/uL (ref 0.1–1.0)
Monocytes Relative: 7 % (ref 3–12)
NEUTROS PCT: 65 % (ref 43–77)
Neutro Abs: 4.1 10*3/uL (ref 1.7–7.7)
PLATELETS: 303 10*3/uL (ref 150–400)
RBC: 4.04 MIL/uL (ref 3.87–5.11)
RDW: 13.6 % (ref 11.5–15.5)
WBC: 6.3 10*3/uL (ref 4.0–10.5)

## 2013-05-28 LAB — TSH: TSH: 1.619 u[IU]/mL (ref 0.350–4.500)

## 2013-05-28 LAB — LDL CHOLESTEROL, DIRECT: Direct LDL: 114 mg/dL — ABNORMAL HIGH

## 2013-05-28 MED ORDER — FLUCONAZOLE 150 MG PO TABS: 150.0000 mg | ORAL_TABLET | Freq: Once | ORAL | Status: DC

## 2013-05-28 NOTE — Patient Instructions (Signed)
Fibromas (Fibroids) Los fibromas son bultos (tumores) que pueden Administrator del cuerpo de Musician. Estos tumores no son cancerosos. Pueden variar en tamao, peso y lugar en el que crecen. CUIDADOS EN EL HOGAR  No tome aspirina.  Anote el nmero de apsitos o tampones que [place] durante el perodo. Infrmelo a su mdico. Esto puede ayudar a determinar el mejor tratamiento para usted. SOLICITE AYUDA DE INMEDIATO SI:  Siente dolor en la zona inferior del vientre (abdomen) y no se alivia con analgsicos.  Tiene clicos que no se calman con medicamentos  Aumenta el sangrado entre perodos o durante el mismo.  Sufre mareos o se desvanece (se desmaya).  El dolor en el vientre [place]. ASEGRESE DE QUE:  Comprende estas instrucciones.  Controlar su enfermedad.  Solicitar ayuda de inmediato si no mejora o empeora. Document Released: 04/18/2010 Document Revised: 03/26/2011 [facility] Patient Information 2014 [place], [place].   Quiste ovrico (Ovarian Cyst) Un quiste ovrico es una bolsa llena de lquido que se forma en el ovario. Los ovarios son los rganos pequeos que producen vulos en las mujeres. Se pueden formar varios tipos de [employer]. Benito Mccreedy no son cancerosos. Muchos de ellos no causan problemas y con frecuencia desaparecen solos. Algunos pueden provocar sntomas y requerir Clinical research associate. Los tipos ms comunes de quistes ovricos son los siguientes:  Quistes funcionales: estos quistes pueden aparecer todos los meses durante el ciclo menstrual. Esto es normal. Estos quistes suelen desaparecer con el prximo ciclo menstrual si la mujer no queda embarazada. En general, los quistes funcionales no tienen sntomas.  Endometriomas: estos quistes se forman a partir del tejido que recubre el tero. Tambin se denominan "quistes de chocolate" porque se llenan de sangre que se vuelve marrn. Este tipo de quiste puede Engineer, production en la zona inferior del  abdomen durante la relacin sexual y con el perodo menstrual.  Cistoadenomas: este tipo se desarrolla a partir de las clulas que se [place] en el exterior del ovario. Estos quistes pueden ser muy grandes y causar dolor en la zona inferior del abdomen y durante la relacin sexual. Cindra Presume tipo de quiste puede girar sobre s mismo, cortar el suministro de Biochemist, clinical y causar un dolor intenso. Tambin se puede romper con facilidad y Stage manager.  Quistes dermoides: este tipo de quiste a veces se encuentra en ambos ovarios. Estos quistes pueden [employer] tipos de tejidos del organismo, como piel, dientes, pelo o Database administrator. Generalmente no tienen sntomas, a menos que sean muy grandes.  Quistes tecalutenicos: aparecen cuando se produce demasiada cantidad de cierta hormona (gonadotropina corinica humana) que estimula en exceso al ovario para que produzca vulos. Esto es ms frecuente despus de procedimientos que ayudan a la concepcin de un beb (fertilizacin in vitro). CAUSAS   Los medicamentos para la fertilidad pueden provocar una afeccin mediante la cual se forman mltiples quistes de gran tamao en los ovarios. Esta se denomina sndrome de hiperestimulacin ovrica.  El sndrome del ovario poliqustico es una afeccin que puede causar desequilibrios hormonales, los cuales pueden dar como resultado quistes ovricos no funcionales. SIGNOS Y SNTOMAS  Muchos quistes ovricos no causan sntomas. Si se presentan sntomas, stos pueden ser:  Dolor o molestias en la pelvis.  Dolor en la parte baja del abdomen.  [place].  Aumento del permetro abdominal (hinchazn).  Perodos menstruales anormales.  Aumento del [employer] perodos [place].  Cese de los perodos menstruales sin estar embarazada. DIAGNSTICO  Estos  quistes se descubren comnmente durante un examen de rutina o una exploracin ginecolgica anual. Es posible que se ordenen otros  estudios para obtener ms informacin sobre el [place]. Estos estudios pueden ser:  Engineer, materials.  Radiografas de la pelvis.  Tomografa computada.  Resonancia magntica.  Anlisis de [place]. TRATAMIENTO  Muchos de los quistes ovricos desaparecen por s solos, sin tratamiento. Es probable que el mdico quiera controlar el quiste regularmente durante 2 o 53meses para ver si se produce algn cambio. En el caso de las mujeres en la menopausia, es particularmente importante controlar de cerca al quiste ya que el ndice de cncer de ovario en las mujeres menopusicas es ms alto. Cuando se requiere Clinical research associate, este puede incluir cualquiera de los siguientes:  Un procedimiento para drenar el quiste (aspiracin). Esto se puede realizar [employer] uso de [place] grande y [place]. Tambin se puede hacer a travs de un procedimiento laparoscpico, En este procedimiento, se inserta un tubo delgado que emite luz y que tiene una pequea cmara en un extremo (laparoscopio) a travs de una pequea incisin.  Ciruga para extirpar el quiste completo. Esto se puede realizar mediante una ciruga laparoscpica o Ardelia Mems ciruga abierta, la cual implica realizar una incisin ms grande en la parte inferior del abdomen.  Tratamiento hormonal o pldoras anticonceptivas. Estos mtodos a veces se usan para ayudar a Writer. [place] solo medicamentos de venta libre o recetados, segn las indicaciones del mdico.  Consulting civil engineer a las consultas de control con su mdico segn las indicaciones.  Hgase exmenes plvicos regulares y pruebas de Papanicolaou. SOLICITE ATENCIN MDICA SI:   Los perodos se atrasan, son irregulares, dolorosos o cesan.  El dolor plvico o abdominal no desaparece.  El abdomen se agranda o se hincha.  Siente presin en la vejiga o no puede vaciarla completamente.  Siente dolor durante las [employer].  Tiene una sensacin  de hinchazn, presin o Manufacturing systems engineer.  Pierde peso sin razn aparente.  Siente un Pharmacist, hospital.  Est estreida.  Pierde el apetito.  Le aparece acn.  Nota un aumento del vello corporal y facial.  Elenore Rota de peso sin hacer modificaciones en su actividad fsica y en su dieta habitual.  Sospecha que est embarazada. SOLICITE ATENCIN MDICA DE INMEDIATO SI:   Siente cada vez ms dolor abdominal.  Tiene malestar estomacal (nuseas) y vomita.  Tiene fiebre que se presenta de [place] repentina.  Siente dolor abdominal al defecar.  Sus perodos menstruales son ms abundantes que lo habitual. Document Released: 10/11/2004 Document Revised: 10/22/2012 ExitCare Patient Information 2014 [place].  Infeccin por cndida en adultos (Candida Infection, Adult) Una infeccin por Cndida (tambin llamado infeccin por hongos levaduriformes, o infeccin por Monilia) es un crecimiento excesivo de hongos que puede ocurrir en cualquier parte del cuerpo. Una infeccin por cndida comnmente ocurre en las zonas ms calientes y hmedas del cuerpo. Usualmente, la infeccin permanece localizada pero puede diseminarse y convertirse en una infeccin sistmica. Una infeccin por cndida puede ser signo de un trastorno ms grave como diabetes, leucemia, o SIDA. Una infeccin por cndida puede ocurrir tanto en hombres como en mujeres. En mujeres, la vaginitis Cndida es una infeccin vaginal. Se trata de una de las causas ms frecuentes de la vaginitis. Los hombres no suelen tener sntomas [employer] se le aparecen otros problemas. Pueden descubrir que tienen una infeccin por hongos porque su compaera sexual los tiene. Es ms probable que  los hombres no circuncisos adquieran una infeccin por hongo que aquellos que estn circuncindados. Esto se debe a que el glande no circuncidado no est expuesto al aire y no se mantiene tan seco como un glande circuncidado. [employer]  pueden desarrollar infecciones por cndida en la zona de la dentadura. CAUSAS Mujeres  Antibiticos.  Medicamento con esteroides Research scientist (medical).  Tener sobrepeso (obesidad).  Diabetes.  Sistema inmune deficiente.  Ciertas enfermedades.  Medicamentos inmunosupresores para pacientes con trasplante de rganos.  Quimioterapia.  [place].  Menstruacin.  Estrs o fatiga.  Consumo de drogas de forma intravenosa.  Anticonceptivos orales.  Utilizar ropa ajustada en la zona de la entrepierna.  Contagio a partir de un compaero sexual que ya tiene la infeccin.  Los espermicidas.  Catteres intravenosos, urinarios, u de otro tipo. Hombres  Contagio a partir de una compaera sexual que ya tiene la infeccin.  Tener sexo oral o anal con una persona que tiene la infeccin.  Los espermicidas.  Diabetes.  Antibiticos.  Sistema inmune deficiente.  Medicamentos que suprimen el sistema inmune.  El uso de drogas por va intravenosa.  Intravenosa, urinarias o catteres otros. SNTOMAS Mujeres  Flujo vaginal espeso y blanco.  Picazn vaginal.  Enrojecimiento e hinchazn en la zona de la vagina.  Irritacin de los labios de la vagina y perineo.  lceras en labios vaginales y perineo.  Relaciones sexuales dolorosas.  Bajo nivel de Dispensing optician (hipoglucemia).  Dolor al [place].  Infecciones en la vejiga.  Problemas intestinales como constipacin, indigestin, mal aliento, hinchazn, gases, diarrea o heces blandas. Hombres  Primero los hombres desarrollan problemas intestinales como constipacin, indigestin, mal aliento, hinchazn, gases, diarrea o heces blandas.  Piel del pene seca y [place] con picazn o molestias.  Prurito de Advertising account executive.  Piel seca y escamosa.  Pie de atleta.  Hipoglucemia. DIAGNSTICO Mujeres  Se revisa el historial y se realiza un anlisis.  La supuracin se observa con un microscopio.  Deber tomarse un  cultivo de Designer, multimedia. Hombres  Se revisa el historial y se realiza un anlisis.  Se observar cualquier secrecin proveniente del pene y la piel quebrada en el microscopio y se Optometrist un cultivo.  Podrn realizarle cultivos de heces. TRATAMIENTO Mujeres  Supositorios y cremas antifngicos vaginales.  Cremas medicadas para disminuir la picazn e irritacin de la zona externa de la vagina.  Aplique una bolsa caliente en la zona del perineo para disminuir molestias o inflamacin.  Medicamentos antifngicos orales.  Supositorios vaginales o cremas medicinales, para infecciones repetidas o recurrentes.  Lave y seque la zona por completo antes de Film/video editor.  La ingesta de yogur con lactobacillus le ayudar con la prevencin y [place].  Si las cremas y supositorios no funcionan, la aplicacin de solucin violeta de genciana puede ayudar. Hombres  Cremas anti hongos y medicamentos orales anti hongos.  A veces el tratamiento deber continuar por 30 das despus de que los sntomas desaparecen para prevenir la recurrencia. INSTRUCCIONES PARA EL CUIDADO DOMICILIARIO Mujeres  Utilice ropa interior de algodn y evite las ropas ajustadas.  Evite el papel higinico de color o perfumado y los tampones o toallitas con desodorante.  No utilice duchas vaginales.  Mantenga la diabetes bajo control.  Tome todos los medicamentos tal como se le indic.  Mantenga su piel limpia y [place].  Consuma leche o yogur con lactobacillus de [place] regular. Si contrae infecciones por levaduras frecuentes y cree saber cul es la infeccin, existen  medicamentos de [employer]. Si la infeccin no parece curarse en 3 das, hable con el mdico.  Comunique a su compaero sexual que padece una infeccin por hongos. El tambin Warden/ranger, en especial si la infeccin no desaparece o es recurrente. Hombres  Mantenga su piel limpia y [place].  Mantenga la diabetes bajo  control.  Tome todos los medicamentos tal como se le indic.  Comunique a su compaera sexual que sufre una infeccin por hongos. SOLICITE ANTENCIN MDICA SI:  Los sntomas no desaparecen o empeoran luego de 1 semana de tratamiento.  Usted tiene una temperatura oral de ms de 38,9 C (102 F).  Presenta dificultades para tragar o para comer durante un tiempo prolongado.  Aparecen ampollas en los genitales.  Si aparece una hemorragia vaginal y no es el momento del perodo.  Siente dolor abdominal.  Presentara problemas intestinales como los ya descritos.  Si se siente dbil o desfalleciente.  Siente dolor al Garment/textile technologist u observa una mayor cantidad [place].  Tiene dolor durante las [employer]. ASEGRESE DE QUE:  Comprende esas instrucciones para el alta mdica.  Controlar su enfermedad.  Pedir ayuda de inmediato si no mejora o empeora. Document Released: 01/01/2005 Document Revised: 03/26/2011 [facility] Patient Information 2014 [place], [place].

## 2013-05-28 NOTE — Assessment & Plan Note (Signed)
Additional counseling today was provided due to he non-compliance with BCP.  She is an orange card holder and would like Depo provera shot but is currently unable to afford any other options.    Advised pt to place a phone alarm or set pills by the bedside or other location to remind her daily to take medications (perferably at the same time).

## 2013-05-28 NOTE — Assessment & Plan Note (Signed)
Will treat again today with another dose of diflucan.  Will obtain blood work today to rule out other possible causes of recurrent infection.

## 2013-05-28 NOTE — Assessment & Plan Note (Addendum)
Considering ongoing pain with large fibroid of uterus as well will refer to GYN for management. Discussed with pt the options that will likely be presented to her for management. Pt was given the option to ask additional questions with interpreter.   AVS in spanish given

## 2013-05-28 NOTE — Progress Notes (Signed)
   Subjective:    Patient ID: Ellen Moody, female    DOB: May 23, 1975, 38 y.o.   MRN: [id number]  HPI Ellen Moody is a 38 y.o. spanish speaking female present today for follow up to pelvic US/abdominal pain.   Abdominal pain: Patient rates her pain an 8/10 with movement and exercise. Her last menstrual period was May 22, 2013. Patient is currently taking Sprintec birth control pills for contraception. Reviewed today the results of her pelvic ultrasound and PAP with Spanish interpreter. Results of ultrasound showed large fundal fibroid and right ovarian cyst. Lengthy discussion on diagnosis and possible treatment options. Patient does not desire additional children.   Yeast infection: Pt reports mild improvement with diflucan last week. She however, still feels she is having a mild discharge and irritation.   Review of Systems Negative, with the exception of above mentioned in HPI     Objective:   Physical Exam BP 105/72  Pulse 75  Temp(Src) 98.3 F (36.8 C) (Oral)  Ht 5' 0.75" (1.543 m)  Wt 129 lb 9.6 oz (58.786 kg)  BMI 24.69 kg/m2  LMP 05/22/2013 Gen: NAD. Spanish Speaking female. CV: RRR  Abd: Soft. Tender to palpation right greater than left abdomen. Right sided mass palpated. ND. BS present.

## 2013-05-28 NOTE — Assessment & Plan Note (Signed)
Considering ongoing pain with large fibroid of uterus and right ovarian cyst refer to GYN for management. Discussed with pt the options that will likely be presented to her for management. Pt was given the option to ask additional questions with interpreter.

## 2013-05-28 NOTE — Assessment & Plan Note (Signed)
Considering ongoing pain with large fibroid of uterus and right ovarian cyst, will refer to GYN for management. Discussed with pt the options that will likely be presented to her for management. Pt was given the option to ask additional questions with interpreter.  AVS provided.

## 2013-05-29 ENCOUNTER — Telehealth: Payer: Self-pay | Admitting: Family Medicine

## 2013-05-29 DIAGNOSIS — R748 Abnormal levels of other serum enzymes: Secondary | ICD-10-CM

## 2013-05-29 NOTE — Telephone Encounter (Signed)
Please call Ellen Moody with Spanish interpreter and inform her of below. Your blood work results have returned and are basically normal, with the exception of a few things. Your cholesterol was mildly high. I would like to test this fasting (do not eat 8 hours prior to test).  An enzyme your liver produces is  also mildly elevated. I would like to complete additional lab work checking your liver. Please call in and make a lab appointment to have your blood drawn only. I will inform you of the results once they are available. Thanks.

## 2013-05-29 NOTE — Telephone Encounter (Signed)
Marines's can you please help with this.thank you.Blanchard

## 2013-05-29 NOTE — Assessment & Plan Note (Addendum)
ALT mildly elevated. No history of liver disease. Of note AST and ALT elevated 5 years ago.  Hep panel, lipid profile, HIV and A1c ordered today Pt will be made aware to make lab appt via interpreter line.

## 2013-06-02 ENCOUNTER — Encounter: Payer: Self-pay | Admitting: Obstetrics & Gynecology

## 2013-06-11 NOTE — Telephone Encounter (Signed)
pt has lab appt on 06/01@8 :30   Marines

## 2013-06-15 ENCOUNTER — Other Ambulatory Visit (INDEPENDENT_AMBULATORY_CARE_PROVIDER_SITE_OTHER): Payer: No Typology Code available for payment source

## 2013-06-15 DIAGNOSIS — R748 Abnormal levels of other serum enzymes: Secondary | ICD-10-CM

## 2013-06-15 LAB — HEPATITIS PANEL, ACUTE
HCV Ab: NEGATIVE
HEP A IGM: NONREACTIVE
HEP B C IGM: NONREACTIVE
HEP B S AG: NEGATIVE

## 2013-06-15 LAB — LIPID PANEL
CHOLESTEROL: 153 mg/dL (ref 0–200)
HDL: 41 mg/dL (ref >39)
LDL Cholesterol: 95 mg/dL (ref 0–99)
Total CHOL/HDL Ratio: 3.7 Ratio
Triglycerides: 86 mg/dL (ref <150)
VLDL: 17 mg/dL (ref 0–40)

## 2013-06-15 LAB — HIV ANTIBODY (ROUTINE TESTING W REFLEX): HIV: NONREACTIVE

## 2013-06-15 LAB — POCT GLYCOSYLATED HEMOGLOBIN (HGB A1C): Hemoglobin A1C: 4.8

## 2013-06-15 NOTE — Progress Notes (Signed)
FLP,HIV,HEPATITIS PANEL AND A1C DONE TODAY Ellen Moody

## 2013-06-16 ENCOUNTER — Encounter: Payer: Self-pay | Admitting: Family Medicine

## 2013-07-09 ENCOUNTER — Ambulatory Visit (INDEPENDENT_AMBULATORY_CARE_PROVIDER_SITE_OTHER): Payer: No Typology Code available for payment source | Admitting: Obstetrics & Gynecology

## 2013-07-09 ENCOUNTER — Encounter: Payer: Self-pay | Admitting: Obstetrics & Gynecology

## 2013-07-09 VITALS — BP 124/82 | HR 68 | Ht 60.75 in | Wt 127.3 lb

## 2013-07-09 DIAGNOSIS — R102 Pelvic and perineal pain: Secondary | ICD-10-CM

## 2013-07-09 DIAGNOSIS — N949 Unspecified condition associated with female genital organs and menstrual cycle: Secondary | ICD-10-CM

## 2013-07-09 DIAGNOSIS — D251 Intramural leiomyoma of uterus: Secondary | ICD-10-CM

## 2013-07-09 DIAGNOSIS — N920 Excessive and frequent menstruation with regular cycle: Secondary | ICD-10-CM

## 2013-07-09 NOTE — Patient Instructions (Signed)
Fibroma uterino (Uterine Fibroid) Un fibroma uterino es un crecimiento (tumor) dentro del tero. Este tipo de tumor no es canceroso y no se extiende fuera del tero. Podr tener uno o varios fibromas. Los fibromas pueden variar en tamao, peso y el lugar en que se desarrollan dentro del tero. Algunos pueden llegar a ser bastante grandes. La mayora de los fibromas no necesitan tratamiento mdico, pero algunos pueden causar dolor o sangrado abundante durante los perodos y entre ellos. CAUSAS  Un fibroma es el resultado del desarrollo continuo de una nica clula uterina que sigue creciendo (no regulada) que es diferente al resto de las clulas del cuerpo humano. La mayora de las clulas tiene un mecanismo de control que evita que se reproduzcan de manera descontrolada.  SIGNOS Y SNTOMAS   Hemorragias.  Dolor y sensacin de presin en la pelvis.  Problemas en la vejiga debido al tamao del fibroma.  Infertilidad y abortos espontneos, segn el tamao y la ubicacin del fibroma. DIAGNSTICO  Los fibromas uterinos se diagnostican con un examen fsico. El mdico puede palpar los tumores abultados al realizar el examen de la pelvis. Una ecografa puede indicarse para tener informacin del tamao, la ubicacin y el nmero de tumores.  TRATAMIENTO   El mdico puede considerar que es conveniente esperar y prestar atencin. Esto incluye el control del fibroma por parte del mdico para observar si crece o disminuye su tamao.  Podr indicarle un tratamiento hormonal o el uso de un dispositivo intrauterino (DIU).  En algunos casos es necesaria la ciruga para extirpar el fibroma (miomectoma) o el tero (histerectoma). Esto depender de su situacin. Cuando una mujer desea quedar embarazada y los fibromas interfieren en su fertilidad, el mdico puede recomendar la extirpacin del fibroma.  INSTRUCCIONES PARA EL CUIDADO EN EL HOGAR  Los cuidados en el hogar dependen del tratamiento que haya  recibido. En general:   Cumpla con todas las visitas de control, segn le indique su mdico.  Tome slo medicamentos de venta libre o recetados, segn las indicaciones del mdico. Si le recetaron un tratamiento hormonal, tome los medicamentos hormonales como le indicaron. No tome aspirina. Puede ocasionar hemorragias.  Consulte al mdico si debe tomar pldoras de hierro.  Si sus perodos son molestos pero no tan abundantes, acustese con los pies ligeramente elevados por encima del nivel del corazn. Coloque compresas fras en la zona inferior del abdomen.  Si sus perodos son muy abundantes, anote el nmero de compresas o tampones que usa cada mes. Lleve esta informacin a su consulta mdica.  Incluya vegetales verdes en su dieta. SOLICITE ATENCIN MDICA DE INMEDIATO SI:  Siente dolor o clicos en la pelvis y no puede controlarlos con los medicamentos.  El dolor en la pelvis aumenta de manera repentina.  Aumenta el sangrado entre los perodos o durante los mismos.  Si tiene perodos muy abundantes y debe cambiar un tampn o una toalla higinica cada media hora o menos.  Se siente mareado o tiene episodios de desmayo. Document Released: 01/01/2005 Document Revised: 10/22/2012 ExitCare Patient Information 2015 [employer], [employer]. This information is not intended to replace advice given to you by your health care provider. Make sure you discuss any questions you have with your health care provider.  

## 2013-07-09 NOTE — Progress Notes (Signed)
Subjective:     Patient ID: Ellen Moody, female   DOB: 11-26-1975, 38 y.o.   MRN: [id number]  HPI Pt presents with c/o heavy bleeding and pelvic pain.  She was diagnosed with uterine fibroids and told that she might need surgery.  She is interested in definitive treatment.  She has completed her fertility.  History reviewed. No pertinent past medical history.  Past Surgical History  Procedure Laterality Date  . Cesarean section  2001   Current Outpatient Prescriptions on File Prior to Visit  Medication Sig Dispense Refill  . norgestimate-ethinyl estradiol (SPRINTEC 28) 0.25-35 MG-MCG tablet Take 1 tablet by mouth daily.  1 Package  11  . fluconazole (DIFLUCAN) 150 MG tablet Take 1 tablet (150 mg total) by mouth once.  1 tablet  0   No current facility-administered medications on file prior to visit.   No Known Allergies History   Social History  . Marital Status: Married    Spouse Name: N/A    Number of Children: N/A  . Years of Education: N/A   Occupational History  . Not on file.   Social History Main Topics  . Smoking status: Never Smoker   . Smokeless tobacco: Not on file  . Alcohol Use: Not on file  . Drug Use: Not on file  . Sexual Activity: Not on file   Other Topics Concern  . Not on file   Social History Narrative  . No narrative on file     Review of Systems     Objective:   Physical Exam BP 124/82  Pulse 68  Ht 5' 0.75" (1.543 m)  Wt 127 lb 4.8 oz (57.743 kg)  BMI 24.25 kg/m2  LMP 07/03/2013 Pt in NAD Abd: soft, ND, NT.  Well healed vertical incision on her abd GU: EGBUS: no lesions Vagina: no blood in vault Cervix: no lesion; no mucopurulent d/c Uterus: enlarged 16-18 weeks sized, mobile Adnexa: no masses; non tender   06/2013 Normal PAP    05/21/2013 CLINICAL DATA: Pelvic pain  EXAM:  TRANSABDOMINAL AND TRANSVAGINAL ULTRASOUND OF PELVIS  TECHNIQUE:  Both transabdominal and transvaginal ultrasound examinations of the  pelvis  were performed. Transabdominal technique was performed for  global imaging of the pelvis including uterus, ovaries, adnexal  regions, and pelvic cul-de-sac. It was necessary to proceed with  endovaginal exam following the transabdominal exam to visualize the  uterus and endometrium.  COMPARISON: None  FINDINGS:  Uterus  Measurements: 14.4 x 8.5 x 11.9 cm. Large fundal fibroid measures  8.8 x 8.9 x 11.9 cm. No fibroids or other mass visualized.  Endometrium  Thickness: 6.6 mm. No focal abnormality visualized.  Right ovary  Measurements: 4 x 3.2 x 5.7 cm. There are 2 cysts within the right  ovary. The largest measures 3.8 x 2.8 x 3.6 cm. No complicating  features are associated with the cysts. There are no abnormal areas  of septation or nodularity.  Left ovary  Measurements: 4.6 x 2.3 x 4.5 cm. Normal appearance/no adnexal  mass.  Other findings  Trace free fluid is identified within the pelvis.  IMPRESSION:  1. Large fundal fibroid.  2. Right ovarian cysts. This is almost certainly benign, and no  specific imaging follow up is recommended according to the Society  of Radiologists in [place] Statement (D  Clovis Riley et al. Management of Asymptomatic Ovarian and Other Adnexal  Cysts Imaged at [place]: Society of Radiologists in [place] Statement 2010. Radiology 256 (Sept  2010): 803-212.).   CBC    Component Value Date/Time   WBC 6.3 05/28/2013 1145   RBC 4.04 05/28/2013 1145   HGB 12.2 05/28/2013 1145   HCT 36.0 05/28/2013 1145   PLT 303 05/28/2013 1145   MCV 89.1 05/28/2013 1145   MCH 30.2 05/28/2013 1145   MCHC 33.9 05/28/2013 1145   RDW 13.6 05/28/2013 1145   LYMPHSABS 1.6 05/28/2013 1145   MONOABS 0.4 05/28/2013 1145   EOSABS 0.1 05/28/2013 1145   BASOSABS 0.1 05/28/2013 1145        Assessment:     Symptomatic uterine fibroids- reviewed option of myomectomy, [place] and Hyst        Plan:     Patient desires surgical management with  Total abdominal hysterectomy with bilateral salpingectomy.  The risks of surgery were discussed in detail with the patient including but not limited to: bleeding which may require transfusion or reoperation; infection which may require prolonged hospitalization or re-hospitalization and antibiotic therapy; injury to bowel, bladder, ureters and major vessels or other surrounding organs; need for additional procedures including laparotomy; thromboembolic phenomenon, incisional problems and other postoperative or anesthesia complications.  Patient was told that the likelihood that her condition and symptoms will be treated effectively with this surgical management was very high; the postoperative expectations were also discussed in detail. The patient also understands the alternative treatment options which were discussed in full. All questions were answered.  She was told that she will be contacted by our surgical scheduler regarding the time and date of her surgery; routine preoperative instructions of having nothing to eat or drink after midnight on the day prior to surgery and also coming to the hospital 1 1/2 hours prior to her time of surgery were also emphasized.  She was told she may be called for a preoperative appointment about a week prior to surgery and will be given further preoperative instructions at that visit. Printed patient education handouts about the procedure were given to the patient to review at home. Pt to f/u 2 weeks prior to surgery for another preop conversation.

## 2013-07-09 NOTE — Progress Notes (Signed)
Patient states that she was referred to [place] because they saw fibroids on her ultrasound and a cyst.

## 2013-07-13 ENCOUNTER — Encounter: Payer: Self-pay | Admitting: General Practice

## 2013-08-27 ENCOUNTER — Ambulatory Visit (INDEPENDENT_AMBULATORY_CARE_PROVIDER_SITE_OTHER): Payer: No Typology Code available for payment source | Admitting: Obstetrics & Gynecology

## 2013-08-27 ENCOUNTER — Encounter: Payer: Self-pay | Admitting: Obstetrics & Gynecology

## 2013-08-27 VITALS — BP 107/67 | HR 69 | Ht 60.75 in | Wt 129.0 lb

## 2013-08-27 DIAGNOSIS — D259 Leiomyoma of uterus, unspecified: Secondary | ICD-10-CM

## 2013-08-27 NOTE — Patient Instructions (Signed)
Histerectoma abdominal, cuidados posteriores (Abdominal Hysterectomy, Care After) Siga estas instrucciones durante las prximas semanas. Estas indicaciones le proporcionan informacin general acerca de cmo deber cuidarse despus del procedimiento. El mdico tambin podr darle instrucciones ms especficas. El tratamiento se ha planificado de acuerdo a las prcticas mdicas actuales, pero a veces se producen problemas. Comunquese con el mdico si tiene algn problema o tiene dudas despus del procedimiento.  QU ESPERAR DESPUS DEL PROCEDIMIENTO Despus del procedimiento, es normal tener los siguientes sntomas:  Dolor.  Cansancio.  Prdida del apetito.  Menos inters sexual. INSTRUCCIONES PARA EL CUIDADO EN EL HOGAR  La recuperacin de esta ciruga demora de 4a 6semanas. Asegrese de seguir todas las indicaciones del mdico. Las instrucciones para el cuidado en el hogar pueden incluir:  Tome los medicamentos para calmar el dolor solamente como se lo indic el mdico. No tome medicamentos de venta libre para calmar el dolor sin consultar antes al mdico.  Cambie el vendaje como se lo indic el mdico.  Debe ver nuevamente al mdico para que le saque las suturas.  Tome duchas en lugar de baos durante 2 a 3 semanas. Pregntele al mdico cundo es seguro comenzar a tomar duchas.  No se haga duchas vaginales, no use tampones ni tenga relaciones sexuales durante al menos 6semanas o hasta que el mdico la autorice.  Siga las indicaciones del mdico con respecto a la actividad fsica, a levantar objetos, a conducir el automvil y a las actividades en general.  Descanse y duerma lo suficiente.  No levante nada que sea ms pesado que un galn de leche (alrededor de 10lb [4.5kg]) durante el primer mes posterior a la ciruga.  Puede retomar su dieta normal si el mdico la autoriza.  No beba alcohol hasta que el mdico se lo autorice.  Si tiene estreimiento, pregntele al mdico si  puede tomar un laxante suave.  Los alimentos con alto contenido de fibra tambin pueden tener un efecto laxante. Coma muchas frutas y vegetales crudos, cereales integrales y frijoles.  Beba suficiente lquido para mantener la orina clara o de color amarillo plido.  Trate de que haya alguna persona en su casa para ayudarla con las tareas del hogar durante 1 a 2 semanas despus de la ciruga.  Cumpla con todas las visitas de control. SOLICITE ATENCIN MDICA SI:   Siente escalofros o tiene fiebre.  Tiene sntomas de hinchazn, enrojecimiento o dolor en el rea de la incisin que estn empeorando.  Tiene pus en el lugar de la incisin.  Advierte un olor ftido que proviene de la incisin o del vendaje.  La herida se abre.  Se siente mareada o sufre un desmayo.  Siente dolor o tiene una hemorragia al orinar.  Tiene diarrea persistente.  Tiene nuseas o vmitos persistentes.  Tiene flujo vaginal anormal.  Tiene una erupcin cutnea.  Tiene alguna reaccin anormal o aparece una alergia por los medicamentos.  El medicamento no le calma el dolor. SOLICITE ATENCIN MDICA DE INMEDIATO SI:   Tiene fiebre y los sntomas empeoran repentinamente.  Siente un dolor abdominal intenso.  Siente dolor en el pecho.  Le falta el aire.  Se desmaya.  Siente dolor, u observa hinchazn o enrojecimiento en la pierna.  Tiene una hemorragia vaginal abundante, con cogulos. ASEGRESE DE QUE:  Comprende estas instrucciones.  Controlar su afeccin.  Recibir ayuda de inmediato si no mejora o si empeora. Document Released: 07/15/2006 Document Revised: 01/06/2013 ExitCare Patient Information 2015 [employer]. This information is not intended to replace   advice given to you by your health care provider. Make sure you discuss any questions you have with your health care provider.  

## 2013-08-27 NOTE — Progress Notes (Signed)
Patient ID: Ellen Moody, female   DOB: Mar 15, 1975, 38 y.o.   MRN: [id number] Patient desires surgical management with total abdominal hysterectomy with bilateral salpingectomy.  The risks of surgery were discussed in detail with the patient including but not limited to: bleeding which may require transfusion or reoperation; infection which may require prolonged hospitalization or re-hospitalization and antibiotic therapy; injury to bowel, bladder, ureters and major vessels or other surrounding organs; need for additional procedures including laparotomy; thromboembolic phenomenon, incisional problems and other postoperative or anesthesia complications.  Patient was told that the likelihood that her condition and symptoms will be treated effectively with this surgical management was very high; the postoperative expectations were also discussed in detail. The patient also understands the alternative treatment options which were discussed in full. All questions were answered.

## 2013-09-03 ENCOUNTER — Encounter (HOSPITAL_COMMUNITY)
Admission: RE | Admit: 2013-09-03 | Discharge: 2013-09-03 | Disposition: A | Discharge status: Home / Self Care | Payer: No Typology Code available for payment source | Source: Ambulatory Visit | Attending: Obstetrics & Gynecology | Admitting: Obstetrics & Gynecology

## 2013-09-03 ENCOUNTER — Encounter (HOSPITAL_COMMUNITY): Payer: Self-pay

## 2013-09-03 DIAGNOSIS — Z01818 Encounter for other preprocedural examination: Secondary | ICD-10-CM | POA: Insufficient documentation

## 2013-09-03 DIAGNOSIS — D259 Leiomyoma of uterus, unspecified: Secondary | ICD-10-CM | POA: Insufficient documentation

## 2013-09-03 DIAGNOSIS — N949 Unspecified condition associated with female genital organs and menstrual cycle: Secondary | ICD-10-CM | POA: Insufficient documentation

## 2013-09-03 DIAGNOSIS — N926 Irregular menstruation, unspecified: Secondary | ICD-10-CM | POA: Insufficient documentation

## 2013-09-03 DIAGNOSIS — N939 Abnormal uterine and vaginal bleeding, unspecified: Secondary | ICD-10-CM | POA: Insufficient documentation

## 2013-09-03 LAB — ABO/RH: ABO/RH(D): A POS

## 2013-09-03 LAB — CBC
HEMATOCRIT: 36.9 % (ref 36.0–46.0)
Hemoglobin: 12.9 g/dL (ref 12.0–15.0)
MCH: 30.6 pg (ref 26.0–34.0)
MCHC: 35 g/dL (ref 30.0–36.0)
MCV: 87.6 fL (ref 78.0–100.0)
Platelets: 269 10*3/uL (ref 150–400)
RBC: 4.21 MIL/uL (ref 3.87–5.11)
RDW: 12.5 % (ref 11.5–15.5)
WBC: 5.1 10*3/uL (ref 4.0–10.5)

## 2013-09-03 LAB — TYPE AND SCREEN
ABO/RH(D): A POS
ANTIBODY SCREEN: NEGATIVE

## 2013-09-03 NOTE — Pre-Procedure Instructions (Signed)
Used [employer], Administrator, sports during [employer].

## 2013-09-03 NOTE — Patient Instructions (Addendum)
   Your procedure is scheduled on:  Tuesday, August 25  Enter through the [employer] of [facility] at: [place] up the phone at the desk and dial [phone] and inform us of your arrival.  Please call this number if you have any problems the morning of surgery: [phone]  Remember: Do not eat food after midnight: Monday Do not drink clear liquids after: 830 AM Tuesday, day of surgery Take these medicines the morning of surgery with a SIP OF WATER: None  Do not wear jewelry, make-up, or FINGER nail polish No metal in your hair or on your body. Do not wear lotions, powders, perfumes.  You may wear deodorant.  Do not bring valuables to the hospital. Contacts, dentures or bridgework may not be worn into surgery.  Leave suitcase in the car. After Surgery it may be brought to your room. For patients being admitted to the hospital, checkout time is 11:00am the day of discharge.  Home with daughter Janett Billow cell  [phone].

## 2013-09-08 ENCOUNTER — Inpatient Hospital Stay (HOSPITAL_COMMUNITY): Payer: No Typology Code available for payment source | Admitting: Anesthesiology

## 2013-09-08 ENCOUNTER — Encounter (HOSPITAL_COMMUNITY): Payer: Self-pay | Admitting: Anesthesiology

## 2013-09-08 ENCOUNTER — Inpatient Hospital Stay (HOSPITAL_COMMUNITY)
Admission: RE | Admit: 2013-09-08 | Discharge: 2013-09-09 | DRG: 743 | Disposition: A | Discharge status: Home / Self Care | Payer: Self-pay | Source: Ambulatory Visit | Attending: Obstetrics & Gynecology | Admitting: Obstetrics & Gynecology

## 2013-09-08 ENCOUNTER — Encounter (HOSPITAL_COMMUNITY)
Admission: RE | Disposition: A | Discharge status: Home / Self Care | Payer: Self-pay | Source: Ambulatory Visit | Attending: Obstetrics & Gynecology

## 2013-09-08 ENCOUNTER — Encounter (HOSPITAL_COMMUNITY): Payer: Self-pay | Admitting: Registered Nurse

## 2013-09-08 DIAGNOSIS — N938 Other specified abnormal uterine and vaginal bleeding: Secondary | ICD-10-CM | POA: Diagnosis present

## 2013-09-08 DIAGNOSIS — R109 Unspecified abdominal pain: Secondary | ICD-10-CM

## 2013-09-08 DIAGNOSIS — D251 Intramural leiomyoma of uterus: Secondary | ICD-10-CM | POA: Diagnosis present

## 2013-09-08 DIAGNOSIS — N949 Unspecified condition associated with female genital organs and menstrual cycle: Secondary | ICD-10-CM | POA: Diagnosis present

## 2013-09-08 DIAGNOSIS — Z9889 Other specified postprocedural states: Secondary | ICD-10-CM

## 2013-09-08 DIAGNOSIS — N83209 Unspecified ovarian cyst, unspecified side: Secondary | ICD-10-CM | POA: Diagnosis present

## 2013-09-08 DIAGNOSIS — N920 Excessive and frequent menstruation with regular cycle: Principal | ICD-10-CM | POA: Diagnosis present

## 2013-09-08 DIAGNOSIS — N838 Other noninflammatory disorders of ovary, fallopian tube and broad ligament: Secondary | ICD-10-CM | POA: Diagnosis present

## 2013-09-08 HISTORY — PX: BILATERAL SALPINGECTOMY: SHX5743

## 2013-09-08 HISTORY — PX: ABDOMINAL HYSTERECTOMY: SHX81

## 2013-09-08 LAB — PREGNANCY, URINE: Preg Test, Ur: NEGATIVE

## 2013-09-08 SURGERY — HYSTERECTOMY, ABDOMINAL
Anesthesia: General | Site: Abdomen

## 2013-09-08 MED ORDER — MENTHOL 3 MG MT LOZG
1.0000 | LOZENGE | OROMUCOSAL | Status: DC | PRN
Start: 2013-09-08 — End: 2013-09-09

## 2013-09-08 MED ORDER — SCOPOLAMINE 1 MG/3DAYS TD PT72
1.0000 | MEDICATED_PATCH | TRANSDERMAL | Status: DC
  Administered 2013-09-08: 1.5 mg via TRANSDERMAL

## 2013-09-08 MED ORDER — ONDANSETRON HCL 4 MG/2ML IJ SOLN
INTRAMUSCULAR | Status: DC | PRN
  Administered 2013-09-08: 4 mg via INTRAVENOUS

## 2013-09-08 MED ORDER — LIDOCAINE HCL (CARDIAC) 20 MG/ML IV SOLN
INTRAVENOUS | Status: AC
  Filled 2013-09-08: qty 5

## 2013-09-08 MED ORDER — ONDANSETRON HCL 4 MG/2ML IJ SOLN: 4.0000 mg | Freq: Four times a day (QID) | INTRAMUSCULAR | Status: DC | PRN

## 2013-09-08 MED ORDER — LIDOCAINE HCL (CARDIAC) 20 MG/ML IV SOLN
INTRAVENOUS | Status: DC | PRN
  Administered 2013-09-08: 50 mg via INTRAVENOUS

## 2013-09-08 MED ORDER — DEXAMETHASONE SODIUM PHOSPHATE 10 MG/ML IJ SOLN
INTRAMUSCULAR | Status: DC | PRN
  Administered 2013-09-08: 4 mg via INTRAVENOUS

## 2013-09-08 MED ORDER — KETOROLAC TROMETHAMINE 30 MG/ML IJ SOLN: 30.0000 mg | Freq: Four times a day (QID) | INTRAMUSCULAR | Status: DC

## 2013-09-08 MED ORDER — FENTANYL CITRATE 0.05 MG/ML IJ SOLN
INTRAMUSCULAR | Status: AC
  Filled 2013-09-08: qty 5

## 2013-09-08 MED ORDER — DOCUSATE SODIUM 100 MG PO CAPS
100.0000 mg | ORAL_CAPSULE | Freq: Two times a day (BID) | ORAL | Status: DC
  Administered 2013-09-08 – 2013-09-09 (×2): 100 mg via ORAL
  Filled 2013-09-08 (×2): qty 1

## 2013-09-08 MED ORDER — POLYETHYLENE GLYCOL 3350 17 G PO PACK
17.0000 g | PACK | Freq: Every day | ORAL | Status: DC | PRN
  Filled 2013-09-08: qty 1

## 2013-09-08 MED ORDER — LACTATED RINGERS IV SOLN: INTRAVENOUS | Status: DC

## 2013-09-08 MED ORDER — DIPHENHYDRAMINE HCL 50 MG/ML IJ SOLN: 12.5000 mg | Freq: Four times a day (QID) | INTRAMUSCULAR | Status: DC | PRN

## 2013-09-08 MED ORDER — NEOSTIGMINE METHYLSULFATE 10 MG/10ML IV SOLN
INTRAVENOUS | Status: DC | PRN
  Administered 2013-09-08: 4 mg via INTRAVENOUS

## 2013-09-08 MED ORDER — HYDROMORPHONE HCL PF 1 MG/ML IJ SOLN
INTRAMUSCULAR | Status: DC | PRN
  Administered 2013-09-08: 1 mg via INTRAVENOUS

## 2013-09-08 MED ORDER — NALOXONE HCL 0.4 MG/ML IJ SOLN: 0.4000 mg | INTRAMUSCULAR | Status: DC | PRN

## 2013-09-08 MED ORDER — MIDAZOLAM HCL 2 MG/2ML IJ SOLN
INTRAMUSCULAR | Status: AC
  Filled 2013-09-08: qty 2

## 2013-09-08 MED ORDER — HYDROMORPHONE HCL PF 1 MG/ML IJ SOLN
0.2500 mg | INTRAMUSCULAR | Status: DC | PRN
  Administered 2013-09-08 (×2): 0.5 mg via INTRAVENOUS

## 2013-09-08 MED ORDER — PROPOFOL 10 MG/ML IV BOLUS
INTRAVENOUS | Status: DC | PRN
  Administered 2013-09-08: 200 mg via INTRAVENOUS

## 2013-09-08 MED ORDER — DIPHENHYDRAMINE HCL 12.5 MG/5ML PO ELIX: 12.5000 mg | ORAL_SOLUTION | Freq: Four times a day (QID) | ORAL | Status: DC | PRN

## 2013-09-08 MED ORDER — EPHEDRINE 5 MG/ML INJ
INTRAVENOUS | Status: AC
  Filled 2013-09-08: qty 10

## 2013-09-08 MED ORDER — GLYCOPYRROLATE 0.2 MG/ML IJ SOLN
INTRAMUSCULAR | Status: DC | PRN
  Administered 2013-09-08: 0.6 mg via INTRAVENOUS

## 2013-09-08 MED ORDER — HYDROMORPHONE HCL PF 1 MG/ML IJ SOLN
INTRAMUSCULAR | Status: AC
  Filled 2013-09-08: qty 1

## 2013-09-08 MED ORDER — DEXAMETHASONE SODIUM PHOSPHATE 4 MG/ML IJ SOLN
INTRAMUSCULAR | Status: AC
  Filled 2013-09-08: qty 1

## 2013-09-08 MED ORDER — ONDANSETRON HCL 4 MG PO TABS: 4.0000 mg | ORAL_TABLET | Freq: Four times a day (QID) | ORAL | Status: DC | PRN

## 2013-09-08 MED ORDER — GLYCOPYRROLATE 0.2 MG/ML IJ SOLN
INTRAMUSCULAR | Status: AC
  Filled 2013-09-08: qty 3

## 2013-09-08 MED ORDER — EPHEDRINE SULFATE 50 MG/ML IJ SOLN
INTRAMUSCULAR | Status: DC | PRN
  Administered 2013-09-08: 15 mg via INTRAVENOUS

## 2013-09-08 MED ORDER — ALUM & MAG HYDROXIDE-SIMETH 200-200-20 MG/5ML PO SUSP: 30.0000 mL | ORAL | Status: DC | PRN

## 2013-09-08 MED ORDER — MIDAZOLAM HCL 2 MG/2ML IJ SOLN
INTRAMUSCULAR | Status: DC | PRN
  Administered 2013-09-08: 2 mg via INTRAVENOUS

## 2013-09-08 MED ORDER — SODIUM CHLORIDE 0.9 % IJ SOLN: 9.0000 mL | INTRAMUSCULAR | Status: DC | PRN

## 2013-09-08 MED ORDER — BUPIVACAINE HCL (PF) 0.5 % IJ SOLN
INTRAMUSCULAR | Status: DC | PRN
  Administered 2013-09-08: 30 mL

## 2013-09-08 MED ORDER — FENTANYL CITRATE 0.05 MG/ML IJ SOLN
INTRAMUSCULAR | Status: DC | PRN
  Administered 2013-09-08: 100 ug via INTRAVENOUS
  Administered 2013-09-08 (×3): 50 ug via INTRAVENOUS

## 2013-09-08 MED ORDER — ROCURONIUM BROMIDE 100 MG/10ML IV SOLN
INTRAVENOUS | Status: AC
  Filled 2013-09-08: qty 1

## 2013-09-08 MED ORDER — ONDANSETRON HCL 4 MG/2ML IJ SOLN
INTRAMUSCULAR | Status: AC
  Filled 2013-09-08: qty 2

## 2013-09-08 MED ORDER — SCOPOLAMINE 1 MG/3DAYS TD PT72
MEDICATED_PATCH | TRANSDERMAL | Status: AC
  Administered 2013-09-08: 1.5 mg via TRANSDERMAL
  Filled 2013-09-08: qty 1

## 2013-09-08 MED ORDER — CEFAZOLIN SODIUM-DEXTROSE 2-3 GM-% IV SOLR
INTRAVENOUS | Status: AC
  Filled 2013-09-08: qty 50

## 2013-09-08 MED ORDER — CEFAZOLIN SODIUM-DEXTROSE 2-3 GM-% IV SOLR
2.0000 g | INTRAVENOUS | Status: AC
  Administered 2013-09-08: 2 g via INTRAVENOUS

## 2013-09-08 MED ORDER — IBUPROFEN 600 MG PO TABS
600.0000 mg | ORAL_TABLET | Freq: Four times a day (QID) | ORAL | Status: DC | PRN
  Administered 2013-09-09: 600 mg via ORAL
  Filled 2013-09-08: qty 1

## 2013-09-08 MED ORDER — 0.9 % SODIUM CHLORIDE (POUR BTL) OPTIME
TOPICAL | Status: DC | PRN
  Administered 2013-09-08: 1000 mL

## 2013-09-08 MED ORDER — ROCURONIUM BROMIDE 100 MG/10ML IV SOLN
INTRAVENOUS | Status: DC | PRN
  Administered 2013-09-08: 50 mg via INTRAVENOUS
  Administered 2013-09-08: 10 mg via INTRAVENOUS

## 2013-09-08 MED ORDER — LACTATED RINGERS IV SOLN
INTRAVENOUS | Status: DC
  Administered 2013-09-08 (×3): via INTRAVENOUS

## 2013-09-08 MED ORDER — DEXTROSE-NACL 5-0.45 % IV SOLN
INTRAVENOUS | Status: DC
  Administered 2013-09-08 – 2013-09-09 (×2): via INTRAVENOUS

## 2013-09-08 MED ORDER — PROPOFOL 10 MG/ML IV EMUL
INTRAVENOUS | Status: AC
  Filled 2013-09-08: qty 20

## 2013-09-08 MED ORDER — OXYCODONE-ACETAMINOPHEN 5-325 MG PO TABS
1.0000 | ORAL_TABLET | ORAL | Status: DC | PRN
  Administered 2013-09-09 (×2): 2 via ORAL
  Filled 2013-09-08 (×2): qty 2

## 2013-09-08 MED ORDER — KETOROLAC TROMETHAMINE 30 MG/ML IJ SOLN
30.0000 mg | Freq: Four times a day (QID) | INTRAMUSCULAR | Status: DC
  Administered 2013-09-08 – 2013-09-09 (×2): 30 mg via INTRAVENOUS
  Filled 2013-09-08 (×2): qty 1

## 2013-09-08 MED ORDER — BUPIVACAINE HCL (PF) 0.5 % IJ SOLN
INTRAMUSCULAR | Status: AC
  Filled 2013-09-08: qty 30

## 2013-09-08 MED ORDER — HYDROMORPHONE HCL PF 1 MG/ML IJ SOLN
INTRAMUSCULAR | Status: AC
  Administered 2013-09-08: 0.5 mg via INTRAVENOUS
  Filled 2013-09-08: qty 1

## 2013-09-08 MED ORDER — NEOSTIGMINE METHYLSULFATE 10 MG/10ML IV SOLN
INTRAVENOUS | Status: AC
  Filled 2013-09-08: qty 1

## 2013-09-08 MED ORDER — MORPHINE SULFATE (PF) 1 MG/ML IV SOLN
INTRAVENOUS | Status: DC
  Administered 2013-09-08: 1.5 mg via INTRAVENOUS
  Administered 2013-09-08: 4.5 mg via INTRAVENOUS
  Administered 2013-09-08: 16:00:00 via INTRAVENOUS
  Administered 2013-09-09 (×2): 1.5 mg via INTRAVENOUS
  Filled 2013-09-08: qty 25

## 2013-09-08 MED ORDER — BISACODYL 10 MG RE SUPP: 10.0000 mg | Freq: Every day | RECTAL | Status: DC | PRN

## 2013-09-08 MED ORDER — ONDANSETRON HCL 4 MG/2ML IJ SOLN
4.0000 mg | Freq: Four times a day (QID) | INTRAMUSCULAR | Status: DC | PRN
Start: 2013-09-08 — End: 2013-09-09

## 2013-09-08 MED ORDER — PANTOPRAZOLE SODIUM 40 MG PO TBEC
40.0000 mg | DELAYED_RELEASE_TABLET | Freq: Every day | ORAL | Status: DC
  Administered 2013-09-09: 40 mg via ORAL
  Filled 2013-09-08: qty 1

## 2013-09-08 SURGICAL SUPPLY — 52 items
APL SKNCLS STERI-STRIP NONHPOA (GAUZE/BANDAGES/DRESSINGS) ×4
BARRIER ADHS 3X4 INTERCEED (GAUZE/BANDAGES/DRESSINGS) IMPLANT
BENZOIN TINCTURE PRP APPL 2/3 (GAUZE/BANDAGES/DRESSINGS) ×4 IMPLANT
BINDER ABD UNIV 12 45-62 (WOUND CARE) IMPLANT
BINDER ABDOMINAL 46IN 62IN (WOUND CARE)
BLADE SURG 10 STRL SS (BLADE) ×8 IMPLANT
BRR ADH 4X3 ABS CNTRL BYND (GAUZE/BANDAGES/DRESSINGS)
CANISTER SUCT 3000ML (MISCELLANEOUS) ×4 IMPLANT
CELLS DAT CNTRL 66122 CELL SVR (MISCELLANEOUS) IMPLANT
CLOSURE WOUND 1/2 X4 (GAUZE/BANDAGES/DRESSINGS) ×3
CLOTH BEACON ORANGE TIMEOUT ST (SAFETY) ×4 IMPLANT
CONT PATH 16OZ SNAP LID 3702 (MISCELLANEOUS) ×4 IMPLANT
DECANTER SPIKE VIAL GLASS SM (MISCELLANEOUS) ×2 IMPLANT
DRAPE WARM FLUID 44X44 (DRAPE) ×2 IMPLANT
DRSG OPSITE POSTOP 4X10 (GAUZE/BANDAGES/DRESSINGS) ×4 IMPLANT
DURAPREP 26ML APPLICATOR (WOUND CARE) ×4 IMPLANT
GAUZE SPONGE 4X4 12PLY STRL (GAUZE/BANDAGES/DRESSINGS) ×4 IMPLANT
GAUZE SPONGE 4X4 16PLY XRAY LF (GAUZE/BANDAGES/DRESSINGS) ×4 IMPLANT
GLOVE BIO SURGEON STRL SZ7 (GLOVE) ×4 IMPLANT
GLOVE BIOGEL PI IND STRL 7.0 (GLOVE) ×6 IMPLANT
GLOVE BIOGEL PI INDICATOR 7.0 (GLOVE) ×6
GOWN STRL REUS W/TWL LRG LVL3 (GOWN DISPOSABLE) ×12 IMPLANT
NEEDLE HYPO 22GX1.5 SAFETY (NEEDLE) ×4 IMPLANT
NS IRRIG 1000ML POUR BTL (IV SOLUTION) ×6 IMPLANT
PACK ABDOMINAL GYN (CUSTOM PROCEDURE TRAY) ×4 IMPLANT
PAD ABD 7.5X8 STRL (GAUZE/BANDAGES/DRESSINGS) ×8 IMPLANT
PAD OB MATERNITY 4.3X12.25 (PERSONAL CARE ITEMS) ×4 IMPLANT
PROTECTOR NERVE ULNAR (MISCELLANEOUS) ×4 IMPLANT
RETRACTOR WND ALEXIS 18 MED (MISCELLANEOUS) IMPLANT
RETRACTOR WND ALEXIS 25 LRG (MISCELLANEOUS) IMPLANT
RTRCTR WOUND ALEXIS 18CM MED (MISCELLANEOUS)
RTRCTR WOUND ALEXIS 25CM LRG (MISCELLANEOUS)
SPONGE LAP 18X18 X RAY DECT (DISPOSABLE) ×6 IMPLANT
STAPLER VISISTAT 35W (STAPLE) ×2 IMPLANT
STRIP CLOSURE SKIN 1/2X4 (GAUZE/BANDAGES/DRESSINGS) ×3 IMPLANT
SURGIFLO W/THROMBIN 8M KIT (HEMOSTASIS) ×2 IMPLANT
SUT VIC AB 0 CT1 18XCR BRD8 (SUTURE) ×6 IMPLANT
SUT VIC AB 0 CT1 27 (SUTURE) ×8
SUT VIC AB 0 CT1 27XBRD ANBCTR (SUTURE) ×4 IMPLANT
SUT VIC AB 0 CT1 36 (SUTURE) ×4 IMPLANT
SUT VIC AB 0 CT1 8-18 (SUTURE) ×12
SUT VIC AB 3-0 CT1 27 (SUTURE) ×4
SUT VIC AB 3-0 CT1 TAPERPNT 27 (SUTURE) ×2 IMPLANT
SUT VIC AB 3-0 SH 27 (SUTURE) ×8
SUT VIC AB 3-0 SH 27X BRD (SUTURE) IMPLANT
SUT VIC AB 4-0 KS 27 (SUTURE) IMPLANT
SUT VICRYL 0 TIES 12 18 (SUTURE) ×4 IMPLANT
SYRINGE CONTROL L 12CC (SYRINGE) ×4 IMPLANT
SYRINGE CONTROL LL 12CC (SYRINGE) ×2 IMPLANT
TOWEL OR 17X24 6PK STRL BLUE (TOWEL DISPOSABLE) ×8 IMPLANT
TRAY FOLEY CATH 14FR (SET/KITS/TRAYS/PACK) ×4 IMPLANT
WATER STERILE IRR 1000ML POUR (IV SOLUTION) ×2 IMPLANT

## 2013-09-08 NOTE — Anesthesia Postprocedure Evaluation (Signed)
  Anesthesia Post-op Note  Patient: Ellen Moody  Procedure(s) Performed: Procedure(s): HYSTERECTOMY ABDOMINAL (N/A) BILATERAL SALPINGECTOMY (Bilateral) Patient is awake and responsive. Pain and nausea are reasonably well controlled. Vital signs are stable and clinically acceptable. Oxygen saturation is clinically acceptable. There are no apparent anesthetic complications at this time. Patient is ready for discharge.

## 2013-09-08 NOTE — Brief Op Note (Signed)
09/08/2013  2:38 PM  PATIENT:  Ellen Moody  38 y.o. female  PRE-OPERATIVE DIAGNOSIS:  Large fundal fibroid,pelvic pain and abnormal uterine bleeding  POST-OPERATIVE DIAGNOSIS:  Large fundal fibroid, pelvic pain and abnormal uterine bleeding  PROCEDURE:  Procedure(s): HYSTERECTOMY ABDOMINAL (N/A) BILATERAL SALPINGECTOMY (Bilateral)  SURGEON:  Surgeon(s) and Role:    * Lavonia Drafts, MD - Primary    * Donnamae Jude, MD - Assisting  ANESTHESIA:   general  EBL:  Total I/O In: 2000 [I.V.:2000] Out: 400 [Urine:200; Blood:200]  BLOOD ADMINISTERED:none  DRAINS: none   LOCAL MEDICATIONS USED:  MARCAINE     SPECIMEN:  Source of Specimen:  uterus, cervix and right fallopian tube   DISPOSITION OF SPECIMEN:  PATHOLOGY  COUNTS:  YES  TOURNIQUET:  * No tourniquets in log *  DICTATION: .Note written in EPIC  PLAN OF CARE: Admit to inpatient   PATIENT DISPOSITION:  PACU - hemodynamically stable.   Delay start of Pharmacological VTE agent (>24hrs) due to surgical blood loss or risk of bleeding: yes

## 2013-09-08 NOTE — Progress Notes (Signed)
UR completed 

## 2013-09-08 NOTE — Transfer of Care (Signed)
Immediate Anesthesia Transfer of Care Note  Patient: Ellen Moody  Procedure(s) Performed: Procedure(s): HYSTERECTOMY ABDOMINAL (N/A) BILATERAL SALPINGECTOMY (Bilateral)  Patient Location: PACU  Anesthesia Type:General  Level of Consciousness: awake, alert  and oriented  Airway & Oxygen Therapy: Patient Spontanous Breathing and Patient connected to nasal cannula oxygen  Post-op Assessment: Report given to PACU RN  Post vital signs: Reviewed  Complications: No apparent anesthesia complications

## 2013-09-08 NOTE — Progress Notes (Signed)
Small orange-size clot expelled.

## 2013-09-08 NOTE — Discharge Instructions (Signed)
Histerectoma abdominal (Abdominal Hysterectomy) La histerectoma abdominal es un procedimiento quirrgico en el que se extirpa el tero. El tero es el rgano muscular donde se desarrolla el feto. Esta ciruga puede hacerse por muchos motivos. Puede necesitar una histerectoma abdominal si tiene cncer, tumores, dolor a largo plazo o hemorragia. Tambin pueden hacerle este procedimiento si el tero ha descendido hacia la vagina (prolapso uterino). Segn las causas por las que necesite una histerectoma abdominal, es posible que tambin le extirpen otros rganos del aparato reproductor. Estos podran incluir la parte de la vagina que se conecta con el tero (cuello del tero), los rganos que producen vulos (ovarios) y las trompas que conectan los ovarios con el tero (trompas de [place]). INFORME AL MDICO:   Cualquier alergia que tenga.  Todos los medicamentos que utiliza, incluidos vitaminas, hierbas, gotas oftlmicas, cremas y medicamentos de venta libre.  Problemas previos que usted o los miembros de su familia hayan tenido con el uso de anestsicos.  Enfermedades de la sangre que tenga.  Cirugas previas.  Enfermedades que tenga. RIESGOS Y COMPLICACIONES En general, se trata de un procedimiento seguro. Sin embargo, como en cualquier procedimiento, pueden surgir problemas. La infeccin es el problema ms frecuente despus de una histerectoma abdominal. Otros problemas posibles incluyen:  Hemorragias.  Formacin de cogulos sanguneos que pueden desprenderse y llegar a los pulmones.  Lesin en otros rganos cercanos al tero.  Lesin en los nervios que causa neuralgia.  Menor inters sexual o dolor durante las relaciones sexuales. ANTES DEL PROCEDIMIENTO  La histerectoma abdominal es un procedimiento quirrgico mayor. Puede afectar la percepcin que tiene de usted misma. Hable con el mdico sobre los cambios fsicos y emocionales que puede causar la histerectoma.  Quiz deban  hacerle anlisis de sangre y radiografas antes de la ciruga.  Si fuma, deje de hacerlo. Pdale ayuda al mdico si est teniendo inconvenientes para dejar de fumar.  Deje de tomar medicamentos anticoagulantes segn las indicaciones del mdico.  Pueden indicarle que tome antibiticos o laxantes antes de la ciruga.  No coma ni beba nada durante las 6 a 8 horas previas a la ciruga.  Tome sus medicamentos habituales con un sorbito de agua.  Tome un bao de inmersin o una ducha la noche o la maana anterior al procedimiento. PROCEDIMIENTO  La histerectoma abdominal se hace en el quirfano del hospital.  En la mayora de los casos, se le administrar un medicamento que la har dormir (anestesia general).  El cirujano har un corte (incisin) a travs de la piel en la parte inferior del abdomen.  La incisin puede tener de 5a 7pulgadas de largo. Puede ser horizontal o vertical.  El cirujano apartar el tejido que recubre al tero. Luego, extraer con cuidado el tero junto con cualquier otro rgano del aparato reproductor que necesite extirpar.  La hemorragia se controlar con pinzas o suturas.  El cirujano cerrar la incisin con suturas o clips metlicos. DESPUS DEL PROCEDIMIENTO  Sentir algo de dolor inmediatamente despus del procedimiento.  Le administrarn medicamentos para calmar el dolor cuando est en el rea de recuperacin.  La llevarn a la habitacin del hospital cuando se haya recuperado de la anestesia.  Es posible que deba permanecer en el hospital durante 2a 5das.  Recibir instrucciones para recuperarse en su casa. Document Released: 01/06/2013 ExitCare Patient Information 2015 [employer]. This information is not intended to replace advice given to you by your health care provider. Make sure you discuss any questions you have with your health   care provider.  

## 2013-09-08 NOTE — H&P (Signed)
HPI Pt presents with c/o heavy bleeding and pelvic pain. She was diagnosed with uterine fibroids and told that she might need surgery. She is interested in definitive treatment. She has completed her fertility.   History reviewed. No pertinent past medical history.  Past Surgical History   Procedure  Laterality  Date   .  Cesarean section   2001    Current Outpatient Prescriptions on File Prior to Visit   Medication  Sig  Dispense  Refill   .  norgestimate-ethinyl estradiol (SPRINTEC 28) 0.25-35 MG-MCG tablet  Take 1 tablet by mouth daily.  1 Package  11   .  fluconazole (DIFLUCAN) 150 MG tablet  Take 1 tablet (150 mg total) by mouth once.  1 tablet  0    No current facility-administered medications on file prior to visit.   No Known Allergies  History    Social History   .  Marital Status:  Married     Spouse Name:  N/A     Number of Children:  N/A   .  Years of Education:  N/A    Occupational History   .  Not on file.    Social History Main Topics   .  Smoking status:  Never Smoker   .  Smokeless tobacco:  Not on file   .  Alcohol Use:  Not on file   .  Drug Use:  Not on file   .  Sexual Activity:  Not on file    Other Topics  Concern   .  Not on file    Social History Narrative   .  No narrative on file   Review of Systems  Objective:   Physical Exam BP 129/82  Pulse 69  Temp(Src) 98.2 F (36.8 C) (Oral)  Resp 20  SpO2 98%  Pt in NAD  Abd: soft, ND, NT. Well healed vertical incision on her abd  GU:  EGBUS: no lesions  Vagina: no blood in vault  Cervix: no lesion; no mucopurulent d/c  Uterus: enlarged 16-18 weeks sized, mobile  Adnexa: no masses; non tender   06/2013  Normal PAP  05/21/2013  CLINICAL DATA: Pelvic pain  EXAM:  TRANSABDOMINAL AND TRANSVAGINAL ULTRASOUND OF PELVIS  TECHNIQUE:  Both transabdominal and transvaginal ultrasound examinations of the  pelvis were performed. Transabdominal technique was performed for  global imaging of the pelvis  including uterus, ovaries, adnexal  regions, and pelvic cul-de-sac. It was necessary to proceed with  endovaginal exam following the transabdominal exam to visualize the  uterus and endometrium.  COMPARISON: None  FINDINGS:  Uterus  Measurements: 14.4 x 8.5 x 11.9 cm. Large fundal fibroid measures  8.8 x 8.9 x 11.9 cm. No fibroids or other mass visualized.  Endometrium  Thickness: 6.6 mm. No focal abnormality visualized.  Right ovary  Measurements: 4 x 3.2 x 5.7 cm. There are 2 cysts within the right  ovary. The largest measures 3.8 x 2.8 x 3.6 cm. No complicating  features are associated with the cysts. There are no abnormal areas  of septation or nodularity.  Left ovary  Measurements: 4.6 x 2.3 x 4.5 cm. Normal appearance/no adnexal  mass.  Other findings  Trace free fluid is identified within the pelvis.  IMPRESSION:  1. Large fundal fibroid.  2. Right ovarian cysts. This is almost certainly benign, and no  specific imaging follow up is recommended according to the Society  of Radiologists in [place] Statement Gordy Levan et al.  Management of Asymptomatic Ovarian and Other Adnexal  Cysts Imaged at [place]: Society of Radiologists in [place] Statement 2010. Radiology 256 (Sept 2010): 245-809.).   CBC    Component Value Date/Time   WBC 5.1 09/03/2013 0955   RBC 4.21 09/03/2013 0955   HGB 12.9 09/03/2013 0955   HCT 36.9 09/03/2013 0955   PLT 269 09/03/2013 0955   MCV 87.6 09/03/2013 0955   MCH 30.6 09/03/2013 0955   MCHC 35.0 09/03/2013 0955   RDW 12.5 09/03/2013 0955   LYMPHSABS 1.6 05/28/2013 1145   MONOABS 0.4 05/28/2013 1145   EOSABS 0.1 05/28/2013 1145   BASOSABS 0.1 05/28/2013 1145      Assessment:   Symptomatic uterine fibroids- pt elects for hysterectomy and definitive treatment  Plan:   Patient desires surgical management with Total abdominal hysterectomy with bilateral salpingectomy. The risks of surgery were discussed  in detail with the patient including but not limited to: bleeding which may require transfusion or reoperation; infection which may require prolonged hospitalization or re-hospitalization and antibiotic therapy; injury to bowel, bladder, ureters and major vessels or other surrounding organs; need for additional procedures including laparotomy; thromboembolic phenomenon, incisional problems and other postoperative or anesthesia complications. Patient was told that the likelihood that her condition and symptoms will be treated effectively with this surgical management was very high; the postoperative expectations were also discussed in detail. The patient also understands the alternative treatment options which were discussed in full. All questions were answered.  Complete discussion done with Spanish interpreter.

## 2013-09-08 NOTE — Op Note (Signed)
09/08/2013  2:38 PM  PATIENT:  Ellen Moody  38 y.o. female  PRE-OPERATIVE DIAGNOSIS:  Large fundal fibroid,pelvic pain and abnormal uterine bleeding  POST-OPERATIVE DIAGNOSIS:  Large fundal fibroid, pelvic pain and abnormal uterine bleeding  PROCEDURE:  Procedure(s): HYSTERECTOMY ABDOMINAL (N/A) BILATERAL SALPINGECTOMY (Bilateral)  SURGEON:  Surgeon(s) and Role:    * Lavonia Drafts, MD - Primary    * Donnamae Jude, MD - Assisting  ANESTHESIA:   general  EBL:  Total I/O In: 2000 [I.V.:2000] Out: 400 [Urine:200; Blood:200]  BLOOD ADMINISTERED:none  DRAINS: none   LOCAL MEDICATIONS USED:  MARCAINE     SPECIMEN:  Source of Specimen:  uterus, cervix and right fallopian tube   DISPOSITION OF SPECIMEN:  PATHOLOGY  COUNTS:  YES  TOURNIQUET:  * No tourniquets in log *  DICTATION: .Note written in EPIC  PLAN OF CARE: Admit to inpatient   PATIENT DISPOSITION:  PACU - hemodynamically stable.   Delay start of Pharmacological VTE agent (>24hrs) due to surgical blood loss or risk of bleeding: yes    INDICATIONS: The patient is a 38 y.o. P3 with the aforementioned diagnoses who desires definitive surgical management. On the preoperative visit, the risks, benefits, indications, and alternatives of the procedure were reviewed with the patient. On the day of surgery, the risks of surgery were again discussed with the patient including but not limited to: bleeding which may require transfusion or reoperation; infection which may require antibiotics; injury to bowel, bladder, ureters or other surrounding organs; need for additional procedures; thromboembolic phenomenon, incisional problems and other postoperative/anesthesia complications. Written informed consent was obtained. Discussion performed using an interpreter.  OPERATIVE FINDINGS: A 16 week sized fibroids uterus with one very large fundal fibroid with normal ovaries bilaterally.  Left fallopian tube surgically  absent. SPECIMENS: Uterus,cervix, right fallopian tube sent to pathology  COMPLICATIONS: none.  DESCRIPTION OF PROCEDURE: The patient received intravenous antibiotics and had sequential compression devices applied to her lower extremities while in the preoperative area. She was taken to the operating room and placed under general anesthesia without difficulty. The abdomen and perineum were prepped and draped in a sterile manner, and she was placed in a dorsal supine position. A Foley catheter was inserted into the bladder and attached to constant drainage. After an adequate timeout was performed, a transverse skin incision was made. This incision was taken down to the fascia using a scalpel and cautery with care given to maintain good hemostasis. The fascia was grasped with kocher clamps, tented up and the rectus muscles were dissected off using sharp and blunt dissection on the superior and inferior aspects of the fascial incision. The peritoneum entered sharply without complication. This peritoneal cavity was entered digitally. Attention was then turned to the pelvis. The uterus at this point was noted to be mobilized and was delivered up out of the abdomen. The bowel was packed away with moist laparotomy sponges. The round ligament on the right side was clamped, suture ligated with 0 Vicryl, and transected with electrocautery allowing entry into the broad ligament. Of note, all sutures used in this procedure are 0 Vicryl unless otherwise noted. The left round ligament was not present and appeared to be surgically resected previously. The anterior and posterior leaves of the broad ligament were separated, and the ureters were inspected to be safely away from the area of dissection bilaterally. The uteroovarian ligaments were bliaterally clamped and doubly suture ligated. The uterine arteries were skeletinized bilaterally A bladder flap was then created. The  bladder was then bluntly dissected off the lower  uterine segment and cervix with good hemostasis noted. The uterine arteries were then skeletonized bilaterally and then clamped, cut, and doubly suture ligated with care given to prevent ureteral injury. After the uterine arteries were clamped, the uterus was removed with a scalpel and the cervical stump was grasped with a Jacob's tenaculum. The uterosacral ligaments were then clamped, cut, and ligated bilaterally. Finally, the cardinal ligaments were clamped, cut, and ligated bilaterally. The uterus was removed.  The cuff angles was closed with a running locked suture.  There was oozing along the vaginal cuff and it was oversewed with interrupted suture. The pelvis was irrigated and hemostasis was reconfirmed at all pedicles and along the pelvic sidewall. All laparotomy sponges and instruments were removed from the abdomen. Surgi-flo was placed over the vaginal cuff and the pedicles after the pelvis was irrigated.  The peritoneum was reapproximated with 0 vicryl.  The fascia was closed with 0 vicryl . The subcutaneous fascia was closed with 3-0 vicryl and the skin was a subcuticular suture of 4-0 vicryl. The incision was injected with 30cc of 0.5% Marcaine. Benzoin and steristrips were applied.  Sponge, lap and needle counts were correct times two. The patient was taken to the recovery area awake, extubated and in stable condition.

## 2013-09-08 NOTE — Anesthesia Preprocedure Evaluation (Signed)

## 2013-09-09 ENCOUNTER — Encounter (HOSPITAL_COMMUNITY): Payer: Self-pay | Admitting: Obstetrics & Gynecology

## 2013-09-09 LAB — CBC
HCT: 23.3 % — ABNORMAL LOW (ref 36.0–46.0)
HEMATOCRIT: 24.2 % — AB (ref 36.0–46.0)
Hemoglobin: 8 g/dL — ABNORMAL LOW (ref 12.0–15.0)
Hemoglobin: 8.2 g/dL — ABNORMAL LOW (ref 12.0–15.0)
MCH: 30 pg (ref 26.0–34.0)
MCH: 30.1 pg (ref 26.0–34.0)
MCHC: 34.3 g/dL (ref 30.0–36.0)
MCHC: 33.9 g/dL (ref 30.0–36.0)
MCV: 87.3 fL (ref 78.0–100.0)
MCV: 89 fL (ref 78.0–100.0)
PLATELETS: 212 10*3/uL (ref 150–400)
Platelets: 216 10*3/uL (ref 150–400)
RBC: 2.67 MIL/uL — AB (ref 3.87–5.11)
RBC: 2.72 MIL/uL — AB (ref 3.87–5.11)
RDW: 12.6 % (ref 11.5–15.5)
RDW: 12.9 % (ref 11.5–15.5)
WBC: 8.8 10*3/uL (ref 4.0–10.5)
WBC: 7.7 10*3/uL (ref 4.0–10.5)

## 2013-09-09 MED ORDER — IBUPROFEN 600 MG PO TABS: 600.0000 mg | ORAL_TABLET | Freq: Four times a day (QID) | ORAL | Status: DC | PRN

## 2013-09-09 MED ORDER — DSS 100 MG PO CAPS: 100.0000 mg | ORAL_CAPSULE | Freq: Two times a day (BID) | ORAL | Status: DC

## 2013-09-09 MED ORDER — OXYCODONE-ACETAMINOPHEN 5-325 MG PO TABS: 1.0000 | ORAL_TABLET | ORAL | Status: DC | PRN

## 2013-09-09 NOTE — Progress Notes (Signed)
Discharge instructions provided to patient and significant other at bedside with interpreter.  Medications, activity, incision care, when to call the doctor, follow up appointments and community resources discussed.  No questions at this time.  Patient left unit in stable condition with all personal belongings and prescriptions accompanied by staff.  Leighton Roach, RN------------------------

## 2013-09-09 NOTE — Discharge Summary (Signed)
Physician Discharge Summary  Patient ID: Ellen Moody MRN: [id number] DOB/AGE: June 19, 1975 38 y.o.  Admit date: 09/08/2013 Discharge date: 09/09/2013  Admission Diagnoses: uterine fibroids; pelvic pain and menorrhagia  Discharge Diagnoses: same Active Problems:   Postoperative state   Discharged Condition: good  Hospital Course: pt was admitted for a TAH with unilateral salpingectomy. She had no complications post-op.  She was placed on a Morphine PCA which she did not use often.  She was ambulating on the night of surgery. Her foley was removed on POD #1 and she was able to void without difficulty.  She was passing gas well. She was able to tolerate reg diet. No N/V.       Consults: None  Significant Diagnostic Studies: labs: CBC- stable  Treatments: IV hydration, antibiotics: Ancef and surgery: Total abdominal hysterectomy with salpingectomy  Discharge Exam: Blood pressure 90/50, pulse 82, temperature 98.3 F (36.8 C), temperature source Oral, resp. rate 18, height 5' 0.75" (1.543 m), weight 126 lb (57.153 kg), SpO2 98.00%. General appearance: alert and no distress Resp: clear to auscultation bilaterally Cardio: regular rate and rhythm, S1, S2 normal, no murmur, click, rub or gallop Extremities: extremities normal, atraumatic, no cyanosis or edema Incision/Wound:dressing clean, dry and intact  CBC    Component Value Date/Time   WBC 7.7 09/09/2013 1240   RBC 2.72* 09/09/2013 1240   HGB 8.2* 09/09/2013 1240   HCT 24.2* 09/09/2013 1240   PLT 212 09/09/2013 1240   MCV 89.0 09/09/2013 1240   MCH 30.1 09/09/2013 1240   MCHC 33.9 09/09/2013 1240   RDW 12.9 09/09/2013 1240   LYMPHSABS 1.6 05/28/2013 1145   MONOABS 0.4 05/28/2013 1145   EOSABS 0.1 05/28/2013 1145   BASOSABS 0.1 05/28/2013 1145     Disposition:   Discharge Instructions   Call MD for:  difficulty breathing, headache or visual disturbances    Complete by:  As directed      Call MD for:  extreme fatigue     Complete by:  As directed      Call MD for:  hives    Complete by:  As directed      Call MD for:  persistant dizziness or light-headedness    Complete by:  As directed      Call MD for:  persistant nausea and vomiting    Complete by:  As directed      Call MD for:  redness, tenderness, or signs of infection (pain, swelling, redness, odor or green/yellow discharge around incision site)    Complete by:  As directed      Call MD for:  severe uncontrolled pain    Complete by:  As directed      Call MD for:  temperature >100.4    Complete by:  As directed      Diet - low sodium heart healthy    Complete by:  As directed      Driving Restrictions    Complete by:  As directed   No driving while on pain meds by mouth     Increase activity slowly    Complete by:  As directed      Lifting restrictions    Complete by:  As directed   No lifting for 4 weeks.  No heavy lifting for 6 weeks     Remove dressing in 48 hours    Complete by:  As directed   Remove OUTER dressing in 2 days     Sexual Activity Restrictions  Complete by:  As directed   Nothing in vagina for 6 weeks            Medication List    STOP taking these medications       norgestimate-ethinyl estradiol 0.25-35 MG-MCG tablet  Commonly known as:  SPRINTEC 28      TAKE these medications       aspirin 325 MG tablet  Take 325 mg by mouth daily.     DSS 100 MG Caps  Take 100 mg by mouth 2 (two) times daily.     ibuprofen 600 MG tablet  Commonly known as:  ADVIL,MOTRIN  Take 1 tablet (600 mg total) by mouth every 6 (six) hours as needed (mild pain).     oxyCODONE-acetaminophen 5-325 MG per tablet  Commonly known as:  PERCOCET/ROXICET  Take 1-2 tablets by mouth every 4 (four) hours as needed for severe pain (moderate to severe pain (when tolerating fluids)).           Follow-up Information   Follow up with Lavonia Drafts, MD In 2 weeks.   Specialty:  Obstetrics and Gynecology   Contact  information:   [place] [address] [phone]       Signed: Lavonia Drafts 09/09/2013, 1:50 PM

## 2013-09-09 NOTE — Progress Notes (Signed)
I assisted Forensic psychologist, with discharge instructions. [place] Interpreter.

## 2013-09-09 NOTE — Addendum Note (Signed)
Addendum created 09/09/13 0851 by Tobin Chad, CRNA   Modules edited: Notes Section   Notes Section:  File: [id number]

## 2013-09-09 NOTE — Progress Notes (Signed)
I assisted Danton Sewer, RN with patient assessment. [place] Interpreter.

## 2013-09-09 NOTE — Anesthesia Postprocedure Evaluation (Signed)
  Anesthesia Post-op Note  Patient: Ellen Moody  Procedure(s) Performed: Procedure(s): HYSTERECTOMY ABDOMINAL (N/A) BILATERAL SALPINGECTOMY (Bilateral)  Patient Location: [facility]  Anesthesia Type:General  Level of Consciousness: awake, oriented and patient cooperative  Airway and Oxygen Therapy: Patient Spontanous Breathing  Post-op Pain: none  Post-op Assessment: Post-op Vital signs reviewed, Patient's Cardiovascular Status Stable, PATIENT'S CARDIOVASCULAR STATUS UNSTABLE, Patent Airway, No signs of Nausea or vomiting, Adequate PO intake, Pain level controlled, No headache, No backache, No residual numbness and No residual motor weakness  Post-op Vital Signs: Reviewed and stable  Last Vitals:  Filed Vitals:   09/09/13 0510  BP: 96/53  Pulse: 85  Temp: 37.2 C  Resp: 18    Complications: No apparent anesthesia complications

## 2013-09-09 NOTE — Progress Notes (Signed)
I assisted Dr. Cecille Po with discharge instructions. [place]  Interpreter.

## 2013-09-23 ENCOUNTER — Encounter: Payer: Self-pay | Admitting: Obstetrics & Gynecology

## 2013-09-23 ENCOUNTER — Ambulatory Visit (INDEPENDENT_AMBULATORY_CARE_PROVIDER_SITE_OTHER): Payer: No Typology Code available for payment source | Admitting: Obstetrics & Gynecology

## 2013-09-23 VITALS — BP 106/67 | HR 76 | Temp 98.3°F | Ht 61.0 in | Wt 127.4 lb

## 2013-09-23 DIAGNOSIS — Z9889 Other specified postprocedural states: Secondary | ICD-10-CM

## 2013-09-23 NOTE — Progress Notes (Signed)
Subjective:     Patient ID: Ellen Moody, female   DOB: 11-20-1975, 38 y.o.   MRN: [id number]  HPI Pt c/o back pain since her surgery. She reports that she has been lying around a lot since surgery. Denies trauma.  She denies fever/chills/nausea/vomiting.  She denies bleeding and reports adequate pain control.   Review of Systems     Objective:   Physical Exam BP 106/67  Pulse 76  Temp(Src) 98.3 F (36.8 C) (Oral)  Ht 5\' 1"  (1.549 m)  Wt 127 lb 6.4 oz (57.788 kg)  BMI 24.08 kg/m2  LMP 08/31/2013 Pt in NAD Abd: soft, NT, ND.  Incision clean, dry and intact  09/08/2013 Diagnosis Uterus and cervix, with right tube - BENIGN UTERINE LEIOMYOMA (8.0 CM). - BENIGN PROLIFERATIVE PHASE ENDOMETRIUM; NEGATIVE FOR HYPERPLASIA OR MALIGNANCY. - BENIGN CERVICAL MUCOSA; NEGATIVE FOR INTRAEPITHELIAL LESION OR MALIGNANCY. - BENIGN FALLOPIAN TUBE WITH PARATUBAL CYST; NEGATIVE FOR ATYPIA OR MALIGNANCY.     Assessment:     2 week post op check     Plan:     Pt to f/u in 4 week Nothing per vagina Gradual increase in activities Reviewed path with pt

## 2013-09-23 NOTE — Patient Instructions (Signed)
Histerectoma abdominal, cuidados posteriores (Abdominal Hysterectomy, Care After) Siga estas instrucciones durante las prximas semanas. Estas indicaciones le proporcionan informacin general acerca de cmo deber cuidarse despus del procedimiento. El mdico tambin podr darle instrucciones ms especficas. El tratamiento se ha planificado de acuerdo a las prcticas mdicas actuales, pero a veces se producen problemas. Comunquese con el mdico si tiene algn problema o tiene dudas despus del procedimiento.  QU ESPERAR DESPUS DEL PROCEDIMIENTO Despus del procedimiento, es normal tener los siguientes sntomas:  Dolor.  Cansancio.  Prdida del apetito.  Menos inters sexual. INSTRUCCIONES PARA EL CUIDADO EN EL HOGAR  La recuperacin de esta ciruga demora de 4a 6semanas. Asegrese de seguir todas las indicaciones del mdico. Las instrucciones para el cuidado en el hogar pueden incluir:  Tome los medicamentos para calmar el dolor solamente como se lo indic el mdico. No tome medicamentos de venta libre para calmar el dolor sin consultar antes al mdico.  Cambie el vendaje como se lo indic el mdico.  Debe ver nuevamente al mdico para que le saque las suturas.  Tome duchas en lugar de baos durante 2 a 3 semanas. Pregntele al mdico cundo es seguro comenzar a tomar duchas.  No se haga duchas vaginales, no use tampones ni tenga relaciones sexuales durante al menos 6semanas o hasta que el mdico la autorice.  Siga las indicaciones del mdico con respecto a la actividad fsica, a levantar objetos, a conducir el automvil y a las actividades en general.  Descanse y duerma lo suficiente.  No levante nada que sea ms pesado que un galn de leche (alrededor de 10lb [4.5kg]) durante el primer mes posterior a la ciruga.  Puede retomar su dieta normal si el mdico la autoriza.  No beba alcohol hasta que el mdico se lo autorice.  Si tiene estreimiento, pregntele al mdico si  puede tomar un laxante suave.  Los alimentos con alto contenido de fibra tambin pueden tener un efecto laxante. Coma muchas frutas y vegetales crudos, cereales integrales y frijoles.  Beba suficiente lquido para mantener la orina clara o de color amarillo plido.  Trate de que haya alguna persona en su casa para ayudarla con las tareas del hogar durante 1 a 2 semanas despus de la ciruga.  Cumpla con todas las visitas de control. SOLICITE ATENCIN MDICA SI:   Siente escalofros o tiene fiebre.  Tiene sntomas de hinchazn, enrojecimiento o dolor en el rea de la incisin que estn empeorando.  Tiene pus en el lugar de la incisin.  Advierte un olor ftido que proviene de la incisin o del vendaje.  La herida se abre.  Se siente mareada o sufre un desmayo.  Siente dolor o tiene una hemorragia al orinar.  Tiene diarrea persistente.  Tiene nuseas o vmitos persistentes.  Tiene flujo vaginal anormal.  Tiene una erupcin cutnea.  Tiene alguna reaccin anormal o aparece una alergia por los medicamentos.  El medicamento no le calma el dolor. SOLICITE ATENCIN MDICA DE INMEDIATO SI:   Tiene fiebre y los sntomas empeoran repentinamente.  Siente un dolor abdominal intenso.  Siente dolor en el pecho.  Le falta el aire.  Se desmaya.  Siente dolor, u observa hinchazn o enrojecimiento en la pierna.  Tiene una hemorragia vaginal abundante, con cogulos. ASEGRESE DE QUE:  Comprende estas instrucciones.  Controlar su afeccin.  Recibir ayuda de inmediato si no mejora o si empeora. Document Released: 07/15/2006 Document Revised: 01/06/2013 ExitCare Patient Information 2015 [employer]. This information is not intended to replace   advice given to you by your health care provider. Make sure you discuss any questions you have with your health care provider.  

## 2013-10-01 ENCOUNTER — Encounter: Payer: Self-pay | Admitting: *Deleted

## 2013-10-08 ENCOUNTER — Encounter: Payer: Self-pay | Admitting: Obstetrics & Gynecology

## 2013-10-16 ENCOUNTER — Ambulatory Visit: Payer: No Typology Code available for payment source | Admitting: Obstetrics & Gynecology

## 2013-10-21 ENCOUNTER — Ambulatory Visit (INDEPENDENT_AMBULATORY_CARE_PROVIDER_SITE_OTHER): Payer: No Typology Code available for payment source | Admitting: Family Medicine

## 2013-10-21 ENCOUNTER — Encounter: Payer: Self-pay | Admitting: Family Medicine

## 2013-10-21 VITALS — BP 104/72 | HR 80 | Temp 99.0°F | Wt 128.0 lb

## 2013-10-21 DIAGNOSIS — M542 Cervicalgia: Secondary | ICD-10-CM

## 2013-10-21 DIAGNOSIS — R519 Headache, unspecified: Secondary | ICD-10-CM

## 2013-10-21 DIAGNOSIS — R51 Headache: Secondary | ICD-10-CM

## 2013-10-21 MED ORDER — CYCLOBENZAPRINE HCL 5 MG PO TABS: 5.0000 mg | ORAL_TABLET | Freq: Three times a day (TID) | ORAL | Status: AC | PRN

## 2013-10-21 MED ORDER — IBUPROFEN 600 MG PO TABS: 600.0000 mg | ORAL_TABLET | Freq: Three times a day (TID) | ORAL | Status: AC | PRN

## 2013-10-21 NOTE — Progress Notes (Signed)
Patient ID: Ellen Moody, female   DOB: 19-Aug-1975, 38 y.o.   MRN: [id number]  Spanish phone interpreter utilized during this visit.   HPI:  Pt presents for a same day appointment to discuss   For about 3 weeks has had pain in back of neck. Hasn't gone away at all. Feels tingling and numbness in back of neck. No weakness in hands. Sometimes has feeling that her throat is closing up when the pain in the back of her neck hurts. Has tried over the counter medication creams but no oral medications. No fevers but has some headache.  ROS: See HPI  [place]: recent hysterectomy 1.5 mos ago  PHYSICAL EXAM: BP 104/72  Pulse 80  Temp(Src) 99 F (37.2 C) (Oral)  Wt 128 lb (58.06 kg)  LMP 08/31/2013 Gen: NAD HEENT: NCAT, full ROM of neck with some pain in posterior neck muscles, oropharynx clear and moist without edema or erythema, TM's clear bilat. Posterior neck musclulature TTP bilaterally with some spasm present. Heart: RRR no murmurs Lungs: CTAB, NWOB Neuro: cranial nerves II-XII tested and intact although pt endorses on exam mild decr sensation with light touch to L side of face (but CAN feel, just decr compared to R side). Normal FNF. Speech normal. Full strength in all 4 extremities  ASSESSMENT/PLAN:  1. Head/neck pain: likely related to muscle spasm in posterior neck. Not concerned at this time about mildly decr sesnation to L side of face, as it was not identified by pt prior to exam and she is able to feel. Low risk for any serious intracranial or peripheral pathology. Will rx flexeril and ibuprofen, gentle stretching, heating pad. F/u if not improving. Precepted with Dr. Mingo Amber who agrees with this plan.   FOLLOW UP: F/u as needed if symptoms worsen or do not improve.   [place]. Ardelia Mems, [place]

## 2013-10-21 NOTE — Patient Instructions (Signed)
It was nice to meet you today!  For your neck/headache: -use ibuprofen as needed for pain -take flexeril as needed for muscle spasm -gentle stretching -heating pad  Return if not improving within the next week.  Be well, Dr. Ardelia Mems

## 2013-11-16 ENCOUNTER — Encounter: Payer: Self-pay | Admitting: Family Medicine

## 2013-11-20 ENCOUNTER — Ambulatory Visit (INDEPENDENT_AMBULATORY_CARE_PROVIDER_SITE_OTHER): Payer: Self-pay | Admitting: Obstetrics & Gynecology

## 2013-11-20 ENCOUNTER — Encounter: Payer: Self-pay | Admitting: Obstetrics & Gynecology

## 2013-11-20 VITALS — BP 119/71 | HR 79 | Ht 61.0 in | Wt 135.7 lb

## 2013-11-20 DIAGNOSIS — Z9889 Other specified postprocedural states: Secondary | ICD-10-CM

## 2013-11-20 NOTE — Patient Instructions (Signed)
Histerectoma abdominal, cuidados posteriores (Abdominal Hysterectomy, Care After) Siga estas instrucciones durante las prximas semanas. Estas indicaciones le proporcionan informacin general acerca de cmo deber cuidarse despus del procedimiento. El mdico tambin podr darle instrucciones ms especficas. El tratamiento se ha planificado de acuerdo a las prcticas mdicas actuales, pero a veces se producen problemas. Comunquese con el mdico si tiene algn problema o tiene dudas despus del procedimiento.  QU ESPERAR DESPUS DEL PROCEDIMIENTO Despus del procedimiento, es normal tener los siguientes sntomas:  Dolor.  Cansancio.  Prdida del apetito.  Menos inters sexual. INSTRUCCIONES PARA EL CUIDADO EN EL HOGAR  La recuperacin de esta ciruga demora de 4a 6semanas. Asegrese de seguir todas las indicaciones del mdico. Las instrucciones para el cuidado en el hogar pueden incluir:  Tome los medicamentos para calmar el dolor solamente como se lo indic el mdico. No tome medicamentos de venta libre para calmar el dolor sin consultar antes al mdico.  Cambie el vendaje como se lo indic el mdico.  Debe ver nuevamente al mdico para que le saque las suturas.  Tome duchas en lugar de baos durante 2 a 3 semanas. Pregntele al mdico cundo es seguro comenzar a tomar duchas.  No se haga duchas vaginales, no use tampones ni tenga relaciones sexuales durante al menos 6semanas o hasta que el mdico la autorice.  Siga las indicaciones del mdico con respecto a la actividad fsica, a levantar objetos, a conducir el automvil y a las actividades en general.  Descanse y duerma lo suficiente.  No levante nada que sea ms pesado que un galn de leche (alrededor de 10lb [4.5kg]) durante el primer mes posterior a la ciruga.  Puede retomar su dieta normal si el mdico la autoriza.  No beba alcohol hasta que el mdico se lo autorice.  Si tiene estreimiento, pregntele al mdico si  puede tomar un laxante suave.  Los alimentos con alto contenido de fibra tambin pueden tener un efecto laxante. Coma muchas frutas y vegetales crudos, cereales integrales y frijoles.  Beba suficiente lquido para mantener la orina clara o de color amarillo plido.  Trate de que haya alguna persona en su casa para ayudarla con las tareas del hogar durante 1 a 2 semanas despus de la ciruga.  Cumpla con todas las visitas de control. SOLICITE ATENCIN MDICA SI:   Siente escalofros o tiene fiebre.  Tiene sntomas de hinchazn, enrojecimiento o dolor en el rea de la incisin que estn empeorando.  Tiene pus en el lugar de la incisin.  Advierte un olor ftido que proviene de la incisin o del vendaje.  La herida se abre.  Se siente mareada o sufre un desmayo.  Siente dolor o tiene una hemorragia al orinar.  Tiene diarrea persistente.  Tiene nuseas o vmitos persistentes.  Tiene flujo vaginal anormal.  Tiene una erupcin cutnea.  Tiene alguna reaccin anormal o aparece una alergia por los medicamentos.  El medicamento no le calma el dolor. SOLICITE ATENCIN MDICA DE INMEDIATO SI:   Tiene fiebre y los sntomas empeoran repentinamente.  Siente un dolor abdominal intenso.  Siente dolor en el pecho.  Le falta el aire.  Se desmaya.  Siente dolor, u observa hinchazn o enrojecimiento en la pierna.  Tiene una hemorragia vaginal abundante, con cogulos. ASEGRESE DE QUE:  Comprende estas instrucciones.  Controlar su afeccin.  Recibir ayuda de inmediato si no mejora o si empeora. Document Released: 07/15/2006 Document Revised: 01/06/2013 ExitCare Patient Information 2015 [employer]. This information is not intended to replace   advice given to you by your health care provider. Make sure you discuss any questions you have with your health care provider.  

## 2013-11-20 NOTE — Progress Notes (Signed)
Subjective:     Patient ID: Ellen Moody, female   DOB: 1975/07/26, 38 y.o.   MRN: [id number]  HPI Pt presents for 6 week [ost op check.  She reports no problems. She reports normal bowel and bladder function.  She has not been sexually active since surgery. She denies vasomotor sx.   Review of Systems     Objective:   Physical Exam BP 119/71 mmHg  Pulse 79  Ht 5\' 1"  (1.549 m)  Wt 135 lb 11.2 oz (61.553 kg)  BMI 25.65 kg/m2  LMP 08/31/2013 Pt in NAD Soft: soft, NT, ND. 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 - BENIGN PROLIFERATIVE PHASE ENDOMETRIUM; NEGATIVE FOR HYPERPLASIA OR MALIGNANCY. - BENIGN CERVICAL MUCOSA; NEGATIVE FOR INTRAEPITHELIAL LESION OR MALIGNANCY. - BENIGN FALLOPIAN TUBE WITH PARATUBAL CYST; NEGATIVE FOR ATYPIA OR MALIGNANCY.     Assessment:     6 weeks post op check       Plan:     F/u in 3 months or sooner prn Return to full activity

## 2013-11-24 ENCOUNTER — Ambulatory Visit: Payer: Self-pay

## 2013-12-18 ENCOUNTER — Ambulatory Visit: Payer: Self-pay | Admitting: Obstetrics & Gynecology

## 2014-01-04 ENCOUNTER — Encounter: Payer: Self-pay | Admitting: Family Medicine

## 2014-01-04 ENCOUNTER — Ambulatory Visit (INDEPENDENT_AMBULATORY_CARE_PROVIDER_SITE_OTHER): Payer: Self-pay | Admitting: Family Medicine

## 2014-01-04 VITALS — BP 123/71 | HR 86 | Temp 98.1°F | Ht 61.0 in | Wt 137.9 lb

## 2014-01-04 DIAGNOSIS — N3000 Acute cystitis without hematuria: Secondary | ICD-10-CM

## 2014-01-04 DIAGNOSIS — R300 Dysuria: Secondary | ICD-10-CM

## 2014-01-04 LAB — POCT URINALYSIS DIPSTICK
Bilirubin, UA: NEGATIVE
Glucose, UA: NEGATIVE
Ketones, UA: NEGATIVE
NITRITE UA: NEGATIVE
PROTEIN UA: NEGATIVE
SPEC GRAV UA: 1.01
UROBILINOGEN UA: 0.2
pH, UA: 6.5

## 2014-01-04 LAB — POCT UA - MICROSCOPIC ONLY

## 2014-01-04 MED ORDER — PHENAZOPYRIDINE HCL 200 MG PO TABS: 200.0000 mg | ORAL_TABLET | Freq: Three times a day (TID) | ORAL | Status: DC

## 2014-01-04 MED ORDER — CEPHALEXIN 500 MG PO CAPS: 500.0000 mg | ORAL_CAPSULE | Freq: Three times a day (TID) | ORAL | Status: DC

## 2014-01-04 NOTE — Addendum Note (Signed)
Addended by: [place], Marney Treloar on: 01/04/2014 11:28 AM   Modules accepted: Orders

## 2014-01-04 NOTE — Progress Notes (Signed)
  RVI:FBPPHK, Renee, DO Chief Complaint  Patient presents with  . Abdominal Pain  . urine odor    Current Issues:  Presents with 4 days of dysuria, urinary urgency, urinary frequency and lower abdominal pain Associated symptoms include:  cloudy urine .  Denies fever, chills, sweats, CVA tenderness, hematuria, weight loss, N/V/D.   There is a previous history of of similar symptoms.  Review of Systems: Per HPI  PE:  BP 123/71 mmHg  Pulse 86  Temp(Src) 98.1 F (36.7 C) (Oral)  Ht 5\' 1"  (1.549 m)  Wt 137 lb 14.4 oz (62.551 kg)  BMI 26.07 kg/m2  LMP 08/31/2013 Constitutional: NAD, comfortable  Heart: RRR, no murmurs appreciated  Lungs: CTAB Back: No CVA tenderness B/L  Abdomen: Soft/NT/ND, NABS   Results for orders placed or performed during the hospital encounter of 09/08/13  Pregnancy, urine  Result Value Ref Range   Preg Test, Ur NEGATIVE NEGATIVE  CBC  Result Value Ref Range   WBC 8.8 4.0 - 10.5 K/uL   RBC 2.67 (L) 3.87 - 5.11 MIL/uL   Hemoglobin 8.0 (L) 12.0 - 15.0 g/dL   HCT 23.3 (L) 36.0 - 46.0 %   MCV 87.3 78.0 - 100.0 fL   MCH 30.0 26.0 - 34.0 pg   MCHC 34.3 30.0 - 36.0 g/dL   RDW 12.6 11.5 - 15.5 %   Platelets 216 150 - 400 K/uL  CBC  Result Value Ref Range   WBC 7.7 4.0 - 10.5 K/uL   RBC 2.72 (L) 3.87 - 5.11 MIL/uL   Hemoglobin 8.2 (L) 12.0 - 15.0 g/dL   HCT 24.2 (L) 36.0 - 46.0 %   MCV 89.0 78.0 - 100.0 fL   MCH 30.1 26.0 - 34.0 pg   MCHC 33.9 30.0 - 36.0 g/dL   RDW 12.9 11.5 - 15.5 %   Platelets 212 150 - 400 K/uL  ABO/Rh  Result Value Ref Range   ABO/RH(D) A POS     Assessment and Plan:  1. Dysuria - POCT urinalysis dipstick - + LE and nitrites  - Tx with Keflex and Pyrdium - F/U with PCP PRN

## 2014-01-04 NOTE — Patient Instructions (Signed)

## 2014-01-19 ENCOUNTER — Encounter: Payer: Self-pay | Admitting: Family Medicine

## 2014-01-19 ENCOUNTER — Ambulatory Visit (INDEPENDENT_AMBULATORY_CARE_PROVIDER_SITE_OTHER): Payer: Self-pay | Admitting: Family Medicine

## 2014-01-19 VITALS — BP 133/82 | HR 84 | Temp 98.4°F | Resp 16 | Wt 140.0 lb

## 2014-01-19 DIAGNOSIS — M25532 Pain in left wrist: Secondary | ICD-10-CM | POA: Insufficient documentation

## 2014-01-19 DIAGNOSIS — R0600 Dyspnea, unspecified: Secondary | ICD-10-CM | POA: Insufficient documentation

## 2014-01-19 MED ORDER — ALBUTEROL SULFATE HFA 108 (90 BASE) MCG/ACT IN AERS: 2.0000 | INHALATION_SPRAY | Freq: Four times a day (QID) | RESPIRATORY_TRACT | Status: AC | PRN

## 2014-01-19 MED ORDER — MELOXICAM 15 MG PO TABS: 15.0000 mg | ORAL_TABLET | Freq: Every day | ORAL | Status: AC

## 2014-01-19 NOTE — Assessment & Plan Note (Addendum)
Patient to schedule PFT with Dr. Valentina Lucks as soon as possible. Considering intermittent in nature, could be consistent with intermittent asthma or bronchospasm - albuterol inhaler prescribed today, patient was instructed on proper use of inhaler. - Patient is aware this is for emergencies only and if she is finding herself needing a daily, she needs to come in for another appointment. -Follow-up 4 weeks

## 2014-01-19 NOTE — Progress Notes (Signed)
   Subjective:    Patient ID: Ellen Moody, female    DOB: 02-Dec-1975, 39 y.o.   MRN: [id number]  HPI Spanish-speaking female, Spanish speaking interpreter Shonna Chock present for entire [place].  Intermittent dyspnea: Patient presents to family medicine clinic today for initial evaluation of intermittent dyspnea that's been occurring for approximately one half months. She gives no history of when it is worse or better. She states it's not every day. She reports she feels like her chest gets tight and throat tries to close. Patient has no difficulty swallowing fluids or solids. She denies any chest pain, dyspnea with exertion, palpitations or dizziness. Patient does not have a history of asthma, she is a nonsmoker. She denies any nasal congestions or rhinorrhea, eyes being itchy watery or draining, no ear pain.  Left wrist and joint pain: Patient reports 3 weeks of bilateral intermittent forearm, left wrist and finger pain. She feels it is worse when attempting to pick things up. It Is the worst at the left wrist. In addition she is having right leg spasms. She denies any neuropathy. She denies any trauma to neck, back or shoulders. She currently does not take any medication for pain.  Nonsmoker  Past Medical History  Diagnosis Date  . SVD (spontaneous vaginal delivery)     x 2   No Known Allergies   Review of Systems Per HPI    Objective:   Physical Exam BP 133/82 mmHg  Pulse 84  Temp(Src) 98.4 F (36.9 C) (Oral)  Resp 16  Wt 140 lb (63.504 kg)  SpO2 100%  LMP 08/31/2013  Body mass index is 26.47 kg/(m^2).  Gen: Pleasant, Spanish-speaking female, no acute distress, nontoxic in appearance, well-developed, well-nourished, mildly overweight. HEENT: AT. [place]. Bilateral eyes without injections or icterus. MMM.  CV: RRR, no murmur appreciated Chest: CTAB, no wheeze or crackles Abd: Soft. Round. NTND. BS present. No Masses palpated. MSK:  No erythema. No edema. No soft tissue swelling or  bruising of bilateral forearms or hands. No tenderness to palpation. No bony tenderness or deformity. No pain with range of motion. Normal range of motion. No restriction with pronation or supination of bilateral wrist. Negative Tinel's at elbow and wrist bilaterally. Negative phalens. Negative Finkelstein. Neurovascularly intact distally. Skin: No rashes, purpura or petechiae.  Neuro:  Normal gait. PERLA. EOMi. Alert.     Assessment & Plan:

## 2014-01-19 NOTE — Patient Instructions (Signed)
I have called in the meloxicam for you to use daily for your joint pain. Albuterol inhaler has been prescribed, use this only when your breathing is bad. If you have to use this more than 3 times a week please call and let us know. I want you to schedule appointment with Dr. Valentina Lucks for pulmonary function test. We will call you with results of your labs once they become available.

## 2014-01-19 NOTE — Assessment & Plan Note (Signed)
Uncertain etiology, appears her sister has bad arthritis at a young age, although she doesn't know what kind. Will test sedimentation rate, CRP, ANA rheumatoid factor today. It does not appear to be nerve pain by exam today. Possibly mild tenosynovitis, but on considering bilateral and intermittent. 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 - Meloxicam 50 mg daily Patient to follow-up in 4 weeks.

## 2014-01-20 LAB — RHEUMATOID FACTOR

## 2014-01-20 LAB — SEDIMENTATION RATE: SED RATE: 6 mm/h (ref 0–22)

## 2014-01-20 LAB — C-REACTIVE PROTEIN: CRP: <0.5 mg/dL (ref <0.60)

## 2014-01-20 LAB — ANA: Anti Nuclear Antibody(ANA): NEGATIVE

## 2014-01-21 ENCOUNTER — Telehealth: Payer: Self-pay | Admitting: Family Medicine

## 2014-01-21 NOTE — Telephone Encounter (Signed)
Please call pt, her lab work is reassuring. She does not have indicators for arthritis within her blood. She likely is suffering from nerve or tendon pain and she needs to take the medication prescribed and follow up in 4 weeks. Thanks.

## 2014-01-21 NOTE — Telephone Encounter (Signed)
Ramon LVM for pt to return call to give her the information below. Katharina Caper, Rishan Oyama D

## 2014-01-28 ENCOUNTER — Ambulatory Visit (INDEPENDENT_AMBULATORY_CARE_PROVIDER_SITE_OTHER): Payer: Self-pay | Admitting: Pharmacist

## 2014-01-28 ENCOUNTER — Encounter: Payer: Self-pay | Admitting: Pharmacist

## 2014-01-28 VITALS — Ht 60.0 in | Wt 139.0 lb

## 2014-01-28 DIAGNOSIS — R0600 Dyspnea, unspecified: Secondary | ICD-10-CM

## 2014-01-28 NOTE — Progress Notes (Signed)
Patient ID: Ellen Moody, female   DOB: [birth year], 39 y.o.   MRN: [id number] Reviewed:  Agree with Dr. Graylin Shiver documentation and management.

## 2014-01-28 NOTE — Assessment & Plan Note (Signed)
Spirometry evaluation with normal lung function.   Patient has been experiencing difficulty breathing for a month and a half and taking albuterol PRN, no change in treatment plan at this time, continue PRN albuterol for dyspnea.   Educated patient on purpose and proper use. Reviewed results of pulmonary function tests.  Pt verbalized understanding of results and education.  Written pt instructions provided.  F/U Clinic visit 02/19/14 with Dr. Raoul Pitch.   Total time in face to face counseling 20 minutes.  Patient seen with Randell Patient, PharmD Candidate and Milus Glazier, PharmD Resident.

## 2014-01-28 NOTE — Patient Instructions (Addendum)
It was nice to see you today.   We measured how well your lungs function.   Your lung function is normal.    Next visit with Dr. Raoul Pitch on 02/19/2014   Fue agradable verte hoy .  Medimos qu tan bien su funcin de pulmones .  Su funcin pulmonar es normal.  La prxima visita con el Dr. Raoul Pitch en 02/19/2014

## 2014-01-28 NOTE — Progress Notes (Signed)
S:    Patient arrives in good spirits with interpreter [place].    Presents for lung function evaluation.  Patient reports breathing has been worsening over the last month and a half. Patient reports breathing has been difficult during the day and night.  Patient reports albuterol helps with last albuterol dose 01/27/13 around 1100 AM. Patient reports only using 6 doses since filled. Patient denies history of smoking.    O: See "scanned report" or Documentation Flowsheet (discrete results - PFTs) for  Spirometry results. Patient provided good effort while attempting spirometry.   A/P: Spirometry evaluation with normal lung function.   Patient has been experiencing difficulty breathing for a month and a half and taking albuterol PRN, no change in treatment plan at this time, continue PRN albuterol for dyspnea.   Educated patient on purpose and proper use. Reviewed results of pulmonary function tests.  Pt verbalized understanding of results and education.  Written pt instructions provided.  F/U Clinic visit 02/19/14 with Dr. Raoul Pitch.   Total time in face to face counseling 20 minutes.  Patient seen with Randell Patient, PharmD Candidate and Milus Glazier, PharmD Resident.  Marland Kitchen

## 2014-01-29 NOTE — Addendum Note (Signed)
Addended by: Leavy Cella on: 01/29/2014 11:37 AM   Modules accepted: Orders

## 2014-02-16 ENCOUNTER — Encounter: Payer: Self-pay | Admitting: Family Medicine

## 2014-02-16 ENCOUNTER — Ambulatory Visit (INDEPENDENT_AMBULATORY_CARE_PROVIDER_SITE_OTHER): Payer: Self-pay | Admitting: Family Medicine

## 2014-02-16 VITALS — BP 142/93 | HR 83 | Temp 98.6°F | Wt 133.0 lb

## 2014-02-16 DIAGNOSIS — H8112 Benign paroxysmal vertigo, left ear: Secondary | ICD-10-CM

## 2014-02-16 NOTE — Patient Instructions (Signed)
Nice to see you. We will check some blood work.  Please attempt the exercises provided.   Maniobra de Engineer, petroleum, cuidado personal Immunologist Self-Care) QU ES LA MANIOBRA DE EPLEY? La Yahoo de Epley es un ejercicio que puede Optometrist para Public house manager los sntomas del vrtigo posicional paroxstico benigno (VPPB). Esta afeccin a menudo se conoce como vrtigo. El movimiento de unos pequeos cristales (canalculos) dentro del odo interno ocasiona el VPPB. La acumulacin y el movimiento de los canalculos en el odo interno ocasionan una repentina sensacin de aceleracin (vrtigo) cuando se mueve la cabeza en ciertas posiciones. El vrtigo por lo general dura unos 30das. El VPPB normalmente ocurre solo en un odo. Si siente vrtigo cuando se recuesta sobre el lado izquierdo, probablemente tenga VPPB en el odo izquierdo. El mdico le puede decir qu odo est involucrado.  Una lesin en la cabeza puede ocasionar el VPPB. Muchas personas de ms de 50aos tienen VPPB por motivos desconocidos. Si se le diagnostic VPPB, el mdico puede ensearle a Runner, broadcasting/film/video. El VPPB no es potencialmente mortal (benigno) y normalmente se pasa con el tiempo.  Ewing [place]? Puede realizar [employer] en su casa cuando tenga los sntomas de vrtigo. Puede realizar Neurosurgeon de Epley hasta 3veces en un da hasta que los sntomas de vrtigo desaparezcan. [place] DE EPLEY? 1. Sintese en el borde de una cama o una mesa con la espalda recta. Las piernas deben estar extendidas o colgando sobre el borde de la cama o la mesa. 2. Gire la cabeza a medias hacia el lado del odo afectado. 3. Recustese hacia atrs con la cabeza girada hasta que se encuentre recostado sobre la espalda. Quizs quiera colocar una almohada debajo de los hombros. 4. Mantenga esta posicin durante 30segundos. Es posible que experimente un ataque de vrtigo. Esto es normal. Mantenga  esta posicin hasta que el vrtigo desaparezca. 5. Luego gire la cabeza en direccin opuesta hasta que el odo no afectado est orientado al suelo. 6. Mantenga esta posicin durante 30segundos. Es posible que experimente un ataque de vrtigo. Esto es normal. Mantenga esta posicin hasta que el vrtigo desaparezca. 7. Ahora gire todo el cuerpo hacia el mismo lado que la cabeza. Mantenga esta posicin durante otros 30segundos. 8. Luego, vuelva a sentarse. ESTA MANIOBRA PRESENTA RIESGOS? En algunos casos, puede tener otros sntomas (como cambios en la visin, debilidad o entumecimiento). Si tiene estos sntomas, deje de Statistician y llame al mdico. Memory Dance si realizar esta maniobra lo [place] del vrtigo, es posible que sienta mareos. El mareo es la sensacin de desvanecimiento pero sin la sensacin de [place]. Aunque la Yahoo de Engineer, petroleum lo [place] del vrtigo, es posible que los sntomas vuelvan durante los siguientes 5aos. QU DEBO HACER DESPUS DE ESTA [place]? Puede retomar sus actividades normales despus de Optometrist la [place] de Engineer, petroleum. Pregntele al mdico si debe hacer algo en su casa para prevenir el vrtigo. Esta puede incluir:  Dormir con dos o ms almohadas para Theatre manager la cabeza elevada.  No dormir sobre el lado del odo afectado.  Levantarse lentamente de la cama.  Evitar los movimientos repentinos Agricultural consultant.  Evitar los movimientos de cabeza intensos, como mirar hacia arriba o Office manager.  Utilizar un collar cervical para evitar los movimientos de cabeza repentinos. [place] SI LOS SNTOMAS EMPEORAN? Llame al mdico si el vrtigo empeora. Llame al mdico inmediatamente si tiene otros sntomas, incluidos:   Nuseas.  Vmitos.  Dolor de [place].  Debilidad.  Entumecimiento.  Cambios en la visin. Document Released: 01/06/2013 [facility] Patient Information 2015 [place]. This information is not intended to replace advice given to you  by your health care provider. Make sure you discuss any questions you have with your health care provider.

## 2014-02-17 ENCOUNTER — Encounter: Payer: Self-pay | Admitting: Family Medicine

## 2014-02-17 DIAGNOSIS — H8110 Benign paroxysmal vertigo, unspecified ear: Secondary | ICD-10-CM | POA: Insufficient documentation

## 2014-02-17 LAB — TSH: TSH: 1.524 u[IU]/mL (ref 0.350–4.500)

## 2014-02-17 NOTE — Progress Notes (Signed)
Patient ID: Maryruth Bun, female   DOB: 1975/01/27, 39 y.o.   MRN: [id number]  Tommi Rumps, MD Phone: [phone]  Jazlynne Milliner is a 39 y.o. female who presents today for same day appointment.  Vertigo: patient notes for the past several weeks she has had the sensation that the room is spinning. This occurs with change in position. First time this was with lying down. Sometimes occurs with sitting up. Is sudden onset. She notes that she had one episode of where her whole body went numb. No focal numbness or weakness. Occasionally feels as though she is going to pass out with this, though has never had a syncopal event. Had palpitations last night with the episode. No dry skin. Notes moist clammy hands. No tinnitus. Tea is the only thing that helps.   Patient is a nonsmoker.   ROS: Per HPI   Physical Exam Filed Vitals:   02/16/14 1047  BP: 142/93  Pulse: 83  Temp: 98.6 F (37 C)    Gen: Well NAD HEENT: PERRL,  MMM, normal TM bilaterally Lungs: CTABL Nl WOB Heart: RRR  Skin: hands moist, rest of skin dry Neuro: vision grossly intact, CN 3-12 intact, 5/5 strength in bilateral biceps, triceps, grip, quads, hamstrings, plantar and dorsiflexion, sensation to light touch intact in bilateral UE and LE, normal gait, 2+ patellar reflexes Exts: Non edematous BL  LE, warm and well perfused.  Dix halpike with reproduction of dizziness on head turn to left, no nystagmus   Assessment/Plan: Please see individual problem list.  Tommi Rumps, MD [facility] PGY-3

## 2014-02-17 NOTE — Assessment & Plan Note (Addendum)
Patient with vertigo on dix halpike making BPPV most likely diagnosis. No neurological abnormalities at this time. Given epley maneuver instructions. Will refer to vestibular rehab. Will check TSH given variety of accompanying complaints, though suspect this is related to anxiety from vertigo. Given return precautions.   Precepted with Dr McDiarmid

## 2014-02-18 ENCOUNTER — Encounter: Payer: Self-pay | Admitting: *Deleted

## 2014-02-18 ENCOUNTER — Telehealth: Payer: Self-pay | Admitting: *Deleted

## 2014-02-18 NOTE — Telephone Encounter (Signed)
-----   Message from [name], MD sent at 02/17/2014 10:11 AM EST ----- Please call the patient with interpretor to advise that thyroid function was normal. Thanks.

## 2014-02-18 NOTE — Telephone Encounter (Signed)
Letter mailed. Jazmin Hartsell,CMA  

## 2014-02-19 ENCOUNTER — Ambulatory Visit: Payer: Self-pay | Admitting: Family Medicine

## 2014-03-02 ENCOUNTER — Telehealth: Payer: Self-pay | Admitting: Family Medicine

## 2014-03-02 NOTE — Telephone Encounter (Signed)
Pt called and woanted to know when she will get her referral to a ENT? She has the orange card. jw

## 2014-03-03 NOTE — Telephone Encounter (Signed)
Looking through patient's chart, I don't see where an ENT referral was discussed with myself or another provider. Please ask patient what she needs an ENT referral for, and we will get back to her. If she has been seen for something in the past that I missed, I will be happy to place an ENT referral now that she has an orange card. Otherwise she will need to come in to be reevaluated for her problem. Thank you

## 2014-04-21 ENCOUNTER — Ambulatory Visit (INDEPENDENT_AMBULATORY_CARE_PROVIDER_SITE_OTHER): Payer: Self-pay | Admitting: Family Medicine

## 2014-04-21 ENCOUNTER — Encounter: Payer: Self-pay | Admitting: Family Medicine

## 2014-04-21 ENCOUNTER — Other Ambulatory Visit (HOSPITAL_COMMUNITY)
Admission: RE | Admit: 2014-04-21 | Discharge: 2014-04-21 | Disposition: A | Discharge status: Home / Self Care | Payer: Self-pay | Source: Ambulatory Visit | Attending: Family Medicine | Admitting: Family Medicine

## 2014-04-21 VITALS — BP 129/79 | HR 77 | Temp 97.9°F | Ht 60.0 in | Wt 134.0 lb

## 2014-04-21 DIAGNOSIS — H8112 Benign paroxysmal vertigo, left ear: Secondary | ICD-10-CM

## 2014-04-21 DIAGNOSIS — Z113 Encounter for screening for infections with a predominantly sexual mode of transmission: Secondary | ICD-10-CM | POA: Insufficient documentation

## 2014-04-21 DIAGNOSIS — IMO0002 Reserved for concepts with insufficient information to code with codable children: Secondary | ICD-10-CM | POA: Insufficient documentation

## 2014-04-21 DIAGNOSIS — N898 Other specified noninflammatory disorders of vagina: Secondary | ICD-10-CM

## 2014-04-21 DIAGNOSIS — N760 Acute vaginitis: Secondary | ICD-10-CM

## 2014-04-21 DIAGNOSIS — N941 Dyspareunia: Secondary | ICD-10-CM

## 2014-04-21 LAB — POCT URINALYSIS DIPSTICK
Bilirubin, UA: NEGATIVE
Blood, UA: NEGATIVE
Glucose, UA: NEGATIVE
Ketones, UA: NEGATIVE
Leukocytes, UA: NEGATIVE
Nitrite, UA: NEGATIVE
PH UA: 7.5
Protein, UA: NEGATIVE
SPEC GRAV UA: 1.015
Urobilinogen, UA: 0.2

## 2014-04-21 LAB — POCT WET PREP (WET MOUNT): Clue Cells Wet Prep Whiff POC: NEGATIVE

## 2014-04-21 NOTE — Assessment & Plan Note (Signed)
Symptoms today most consistent with BPPV, however Dix-Hallpike negative Given consistent history negative neuro exam will encourage Epley maneuvers as previously prescribed

## 2014-04-21 NOTE — Patient Instructions (Signed)
Great to meet you!  Try out the exercises, epley maneuvers for BPPV  Give [place] gel a try for the burning with sex  Your studies so far were negative on our tests but we will send the rest when they are back

## 2014-04-21 NOTE — Assessment & Plan Note (Signed)
No etiology on exam for wet prep, however I believe dyspareunia is her more concerning issue after discussion Wet prep negative, GCC pending Explained likely physiologic discharge with burning/itching due to dryness after sex

## 2014-04-21 NOTE — Progress Notes (Signed)
Patient ID: Ellen Moody, female   DOB: 09-Nov-1975, 39 y.o.   MRN: [id number]   HPI  Patient presents today for dizziness and vaginal discharge  Vaginal discharge Patient presents with about one week history of vaginal and perennial itching. She denies any new sexual contacts and has had one sexual contact in the last 6 months She describes a small amount of yellow discharge She states that the itching is severe and she also has burning when she urinates. She denies fevers, chills, sweats, change in appetite, or abdominal pain.   Dizziness She explains that she's had about 2-3 weeks episode of intermittent dizziness. She describes the symptoms as a vertigo with room spinning sensation that happens at night when she lays down or turns her head to the left. There does not seem to be any alleviating factors but resolves on its own She denies any weakness, numbness, tingling Denies headache or ear pain  Smoking status noted - never smoker ROS: Per HPI  Objective: BP 129/79 mmHg  Pulse 77  Temp(Src) 97.9 F (36.6 C) (Oral)  Ht 5' (1.524 m)  Wt 134 lb (60.782 kg)  BMI 26.17 kg/m2  LMP 08/31/2013 Gen: NAD, alert, cooperative with exam HEENT: NCAT, EOMI, PERRL CV: RRR, good S1/S2, no murmur Resp: CTABL, no wheezes, non-labored Abd: SNTND, BS present, no guarding or organomegaly Ext: No edema, warm Neuro: Alert and oriented, strength 5/5 and sensation intact in bilateral upper and lower extremities, cranial nerves II through XII intact, symmetric face, normal speech, normal gait, normal tone  GU: Vaginal walls pink and well rugated, normal-appearing cranium, cervix surgically absent, no adnexal fullness or tenderness with bimanual exam, small amount of thin clear to white vaginal fluid in the vaginal cuff  Assessment and plan:  BPPV (benign paroxysmal positional vertigo) Symptoms today most consistent with BPPV, however Dix-Hallpike negative Given consistent history  negative neuro exam will encourage Epley maneuvers as previously prescribed    Dyspareunia She presented for evaluation of vaginitis, however after lengthy discussion of bleeding or dyspareunia and burning towards the end 6 was the real question she had Recommended over-the-counter lubricant and discussed vaginal dryness with sex Follow up as needed   Vaginitis and vulvovaginitis No etiology on exam for wet prep, however I believe dyspareunia is her more concerning issue after discussion Wet prep negative, GCC pending Explained likely physiologic discharge with burning/itching due to dryness after sex      Orders Placed This Encounter  Procedures  . POCT Wet Prep [employer])  . POCT urinalysis dipstick    No orders of the defined types were placed in this encounter.

## 2014-04-21 NOTE — Assessment & Plan Note (Signed)
She presented for evaluation of vaginitis, however after lengthy discussion of bleeding or dyspareunia and burning towards the end 6 was the real question she had Recommended over-the-counter lubricant and discussed vaginal dryness with sex Follow up as needed

## 2014-04-22 LAB — CERVICOVAGINAL ANCILLARY ONLY
CHLAMYDIA, DNA PROBE: NEGATIVE
Neisseria Gonorrhea: NEGATIVE

## 2014-04-23 ENCOUNTER — Encounter: Payer: Self-pay | Admitting: Family Medicine

## 2018-07-25 ENCOUNTER — Other Ambulatory Visit: Payer: Self-pay | Admitting: *Deleted

## 2018-07-25 DIAGNOSIS — Z20822 Contact with and (suspected) exposure to covid-19: Secondary | ICD-10-CM

## 2018-08-01 LAB — NOVEL CORONAVIRUS, NAA: SARS-CoV-2, NAA: NOT DETECTED
